# Patient Record
Sex: Male | Born: 1990 | Race: Black or African American | Hispanic: No | Marital: Single | Smoking: Current every day smoker
Health system: Southern US, Community
[De-identification: ages and names within clinical notes are randomized; demographics above are authoritative.]

## PROBLEM LIST (undated history)

## (undated) DIAGNOSIS — D582 Other hemoglobinopathies: Secondary | ICD-10-CM

## (undated) DIAGNOSIS — E786 Lipoprotein deficiency: Secondary | ICD-10-CM

## (undated) DIAGNOSIS — M47812 Spondylosis without myelopathy or radiculopathy, cervical region: Secondary | ICD-10-CM

## (undated) DIAGNOSIS — Z720 Tobacco use: Secondary | ICD-10-CM

## (undated) DIAGNOSIS — K529 Noninfective gastroenteritis and colitis, unspecified: Secondary | ICD-10-CM

## (undated) DIAGNOSIS — B078 Other viral warts: Secondary | ICD-10-CM

## (undated) DIAGNOSIS — R718 Other abnormality of red blood cells: Secondary | ICD-10-CM

## (undated) HISTORY — DX: Tobacco use: Z72.0

## (undated) HISTORY — PX: INCISIONAL HERNIA REPAIR: SHX193

## (undated) HISTORY — DX: Lipoprotein deficiency: E78.6

## (undated) HISTORY — PX: NASAL FRACTURE SURGERY: SHX718

---
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
## 2009-03-16 ENCOUNTER — Ambulatory Visit: Payer: Self-pay | Admitting: Surgery

## 2010-02-20 ENCOUNTER — Other Ambulatory Visit: Payer: Self-pay | Admitting: Family Medicine

## 2011-06-29 ENCOUNTER — Emergency Department: Payer: Self-pay | Admitting: Internal Medicine

## 2011-09-05 ENCOUNTER — Ambulatory Visit: Payer: Self-pay | Admitting: Family Medicine

## 2013-03-06 ENCOUNTER — Emergency Department: Payer: Self-pay | Admitting: Emergency Medicine

## 2013-03-10 ENCOUNTER — Emergency Department: Payer: Self-pay | Admitting: Emergency Medicine

## 2015-03-03 ENCOUNTER — Encounter: Payer: Self-pay | Admitting: Family Medicine

## 2015-06-17 ENCOUNTER — Encounter: Payer: Self-pay | Admitting: Family Medicine

## 2015-11-25 ENCOUNTER — Ambulatory Visit (INDEPENDENT_AMBULATORY_CARE_PROVIDER_SITE_OTHER): Payer: Self-pay | Admitting: Family Medicine

## 2015-11-25 ENCOUNTER — Encounter: Payer: Self-pay | Admitting: Family Medicine

## 2015-11-25 VITALS — BP 114/68 | HR 89 | Temp 98.4°F | Resp 16 | Ht 68.0 in | Wt 174.0 lb

## 2015-11-25 DIAGNOSIS — B078 Other viral warts: Secondary | ICD-10-CM

## 2015-11-25 DIAGNOSIS — Z0000 Encounter for general adult medical examination without abnormal findings: Secondary | ICD-10-CM

## 2015-11-25 DIAGNOSIS — Z114 Encounter for screening for human immunodeficiency virus [HIV]: Secondary | ICD-10-CM

## 2015-11-25 DIAGNOSIS — Z720 Tobacco use: Secondary | ICD-10-CM

## 2015-11-25 HISTORY — DX: Other viral warts: B07.8

## 2015-11-25 LAB — COMPREHENSIVE METABOLIC PANEL
ALBUMIN: 4.6 g/dL (ref 3.6–5.1)
ALK PHOS: 64 U/L (ref 40–115)
ALT: 35 U/L (ref 9–46)
AST: 23 U/L (ref 10–40)
BILIRUBIN TOTAL: 2.2 mg/dL — AB (ref 0.2–1.2)
BUN: 8 mg/dL (ref 7–25)
CALCIUM: 9.2 mg/dL (ref 8.6–10.3)
CO2: 22 mmol/L (ref 20–31)
Chloride: 106 mmol/L (ref 98–110)
Creat: 1.09 mg/dL (ref 0.60–1.35)
Glucose, Bld: 86 mg/dL (ref 65–99)
POTASSIUM: 4.1 mmol/L (ref 3.5–5.3)
Sodium: 140 mmol/L (ref 135–146)
TOTAL PROTEIN: 7.1 g/dL (ref 6.1–8.1)

## 2015-11-25 LAB — LIPID PANEL
CHOL/HDL RATIO: 9 ratio — AB (ref <=5.0)
CHOLESTEROL: 162 mg/dL (ref 125–200)
HDL: 18 mg/dL — AB (ref >=40)
LDL Cholesterol: 104 mg/dL (ref <130)
TRIGLYCERIDES: 200 mg/dL — AB (ref <150)
VLDL: 40 mg/dL — ABNORMAL HIGH (ref <30)

## 2015-11-25 NOTE — Patient Instructions (Addendum)
I do encourage you to quit smoking Call [phone] to sign up for smoking cessation classes You can call 1-800-QUIT-NOW to talk with a smoking cessation coach  Steps to Quit Smoking  Smoking tobacco can be harmful to your health and can affect almost every organ in your body. Smoking puts you, and those around you, at risk for developing many serious chronic diseases. Quitting smoking is difficult, but it is one of the best things that you can do for your health. It is never too late to quit. WHAT ARE THE BENEFITS OF QUITTING SMOKING? When you quit smoking, you lower your risk of developing serious diseases and conditions, such as:  Lung cancer or lung disease, such as COPD.  Heart disease.  Stroke.  Heart attack.  Infertility.  Osteoporosis and bone fractures. Additionally, symptoms such as coughing, wheezing, and shortness of breath may get better when you quit. You may also find that you get sick less often because your body is stronger at fighting off colds and infections. If you are pregnant, quitting smoking can help to reduce your chances of having a baby of low birth weight. HOW DO I GET READY TO QUIT? When you decide to quit smoking, create a plan to make sure that you are successful. Before you quit:  Pick a date to quit. Set a date within the next two weeks to give you time to prepare.  Write down the reasons why you are quitting. Keep this list in places where you will see it often, such as on your bathroom mirror or in your car or wallet.  Identify the people, places, things, and activities that make you want to smoke (triggers) and avoid them. Make sure to take these actions:  Throw away all cigarettes at home, at work, and in your car.  Throw away smoking accessories, such as Set designer.  Clean your car and make sure to empty the ashtray.  Clean your home, including curtains and carpets.  Tell your family, friends, and coworkers that you are quitting.  Support from your loved ones can make quitting easier.  Talk with your health care provider about your options for quitting smoking.  Find out what treatment options are covered by your health insurance. WHAT STRATEGIES CAN I USE TO QUIT SMOKING?  Talk with your healthcare provider about different strategies to quit smoking. Some strategies include:  Quitting smoking altogether instead of gradually lessening how much you smoke over a period of time. Research shows that quitting "cold [place]" is more successful than gradually quitting.  Attending in-person counseling to help you build problem-solving skills. You are more likely to have success in quitting if you attend several counseling sessions. Even short sessions of 10 minutes can be effective.  Finding resources and support systems that can help you to quit smoking and remain smoke-free after you quit. These resources are most helpful when you use them often. They can include:  Online chats with a Veterinary surgeon.  Telephone quitlines.  Printed Materials engineer.  Support groups or group counseling.  Text messaging programs.  Mobile phone applications.  Taking medicines to help you quit smoking. (If you are pregnant or breastfeeding, talk with your health care provider first.) Some medicines contain nicotine and some do not. Both types of medicines help with cravings, but the medicines that include nicotine help to relieve withdrawal symptoms. Your health care provider may recommend:  Nicotine patches, gum, or lozenges.  Nicotine inhalers or sprays.  Non-nicotine medicine that is taken  by mouth. Talk with your health care provider about combining strategies, such as taking medicines while you are also receiving in-person counseling. Using these two strategies together makes you more likely to succeed in quitting than if you used either strategy on its own. If you are pregnant or breastfeeding, talk with your health care provider  about finding counseling or other support strategies to quit smoking. Do not take medicine to help you quit smoking unless told to do so by your health care provider. WHAT THINGS CAN I DO TO MAKE IT EASIER TO QUIT? Quitting smoking might feel overwhelming at first, but there is a lot that you can do to make it easier. Take these important actions:  Reach out to your family and friends and ask that they support and encourage you during this time. Call telephone quitlines, reach out to support groups, or work with a counselor for support.  Ask people who smoke to avoid smoking around you.  Avoid places that trigger you to smoke, such as bars, parties, or smoke-break areas at work.  Spend time around people who do not smoke.  Lessen stress in your life, because stress can be a smoking trigger for some people. To lessen stress, try:  Exercising regularly.  Deep-breathing exercises.  Yoga.  Meditating.  Performing a body scan. This involves closing your eyes, scanning your body from head to toe, and noticing which parts of your body are particularly tense. Purposefully relax the muscles in those areas.  Download or purchase mobile phone or tablet apps (applications) that can help you stick to your quit plan by providing reminders, tips, and encouragement. There are many free apps, such as QuitGuide from the [employer] developer for Disease Control and Prevention). You can find other support for quitting smoking (smoking cessation) through smokefree.gov and other websites. HOW WILL I FEEL WHEN I QUIT SMOKING? Within the first 24 hours of quitting smoking, you may start to feel some withdrawal symptoms. These symptoms are usually most noticeable 2-3 days after quitting, but they usually do not last beyond 2-3 weeks. Changes or symptoms that you might experience include:  Mood swings.  Restlessness, anxiety, or irritation.  Difficulty concentrating.  Dizziness.  Strong cravings for sugary foods  in addition to nicotine.  Mild weight gain.  Constipation.  Nausea.  Coughing or a sore throat.  Changes in how your medicines work in your body.  A depressed mood.  Difficulty sleeping (insomnia). After the first 2-3 weeks of quitting, you may start to notice more positive results, such as:  Improved sense of smell and taste.  Decreased coughing and sore throat.  Slower heart rate.  Lower blood pressure.  Clearer skin.  The ability to breathe more easily.  Fewer sick days. Quitting smoking is very challenging for most people. Do not get discouraged if you are not successful the first time. Some people need to make many attempts to quit before they achieve long-term success. Do your best to stick to your quit plan, and talk with your health care provider if you have any questions or concerns.   This information is not intended to replace advice given to you by your health care provider. Make sure you discuss any questions you have with your health care provider.   Document Released: 05/15/2001 Document Revised: 10/05/2014 Document Reviewed: 10/05/2014 Elsevier Interactive Patient Education 2016 [employer]. Health Maintenance, Male A healthy lifestyle and preventative care can promote health and wellness.  Maintain regular health, dental, and eye exams.  Eat a healthy diet. Foods like vegetables, fruits, whole grains, low-fat dairy products, and lean protein foods contain the nutrients you need and are low in calories. Decrease your intake of foods high in solid fats, added sugars, and salt. Get information about a proper diet from your health care provider, if necessary.  Regular physical exercise is one of the most important things you can do for your health. Most adults should get at least 150 minutes of moderate-intensity exercise (any activity that increases your heart rate and causes you to sweat) each week. In addition, most adults need muscle-strengthening  exercises on 2 or more days a week.   Maintain a healthy weight. The body mass index (BMI) is a screening tool to identify possible weight problems. It provides an estimate of body fat based on height and weight. Your health care provider can find your BMI and can help you achieve or maintain a healthy weight. For males 20 years and older:  A BMI below 18.5 is considered underweight.  A BMI of 18.5 to 24.9 is normal.  A BMI of 25 to 29.9 is considered overweight.  A BMI of 30 and above is considered obese.  Maintain normal blood lipids and cholesterol by exercising and minimizing your intake of saturated fat. Eat a balanced diet with plenty of fruits and vegetables. Blood tests for lipids and cholesterol should begin at age 27 and be repeated every 5 years. If your lipid or cholesterol levels are high, you are over age 27, or you are at high risk for heart disease, you may need your cholesterol levels checked more frequently.Ongoing high lipid and cholesterol levels should be treated with medicines if diet and exercise are not working.  If you smoke, find out from your health care provider how to quit. If you do not use tobacco, do not start.  Lung cancer screening is recommended for adults aged 55-80 years who are at high risk for developing lung cancer because of a history of smoking. A yearly low-dose CT scan of the lungs is recommended for people who have at least a 30-pack-year history of smoking and are current smokers or have quit within the past 15 years. A pack year of smoking is smoking an average of 1 pack of cigarettes a day for 1 year (for example, a 30-pack-year history of smoking could mean smoking 1 pack a day for 30 years or 2 packs a day for 15 years). Yearly screening should continue until the smoker has stopped smoking for at least 15 years. Yearly screening should be stopped for people who develop a health problem that would prevent them from having lung cancer treatment.  If  you choose to drink alcohol, do not have more than 2 drinks per day. One drink is considered to be 12 oz (360 mL) of beer, 5 oz (150 mL) of wine, or 1.5 oz (45 mL) of liquor.  Avoid the use of street drugs. Do not share needles with anyone. Ask for help if you need support or instructions about stopping the use of drugs.  High blood pressure causes heart disease and increases the risk of stroke. High blood pressure is more likely to develop in:  People who have blood pressure in the end of the normal range (100-139/85-89 mm Hg).  People who are overweight or obese.  People who are African American.  If you are 5918-27 years of age, have your blood pressure checked every 3-5 years. If you are 27 years of age  or older, have your blood pressure checked every year. You should have your blood pressure measured twice--once when you are at a hospital or clinic, and once when you are not at a hospital or clinic. Record the average of the two measurements. To check your blood pressure when you are not at a hospital or clinic, you can use:  An automated blood pressure machine at a pharmacy.  A home blood pressure monitor.  If you are 6245-27 years old, ask your health care provider if you should take aspirin to prevent heart disease.  Diabetes screening involves taking a blood sample to check your fasting blood sugar level. This should be done once every 3 years after age 27 if you are at a normal weight and without risk factors for diabetes. Testing should be considered at a younger age or be carried out more frequently if you are overweight and have at least 1 risk factor for diabetes.  Colorectal cancer can be detected and often prevented. Most routine colorectal cancer screening begins at the age of 27 and continues through age 27. However, your health care provider may recommend screening at an earlier age if you have risk factors for colon cancer. On a yearly basis, your health care provider may  provide home test kits to check for hidden blood in the stool. A small camera at the end of a tube may be used to directly examine the colon (sigmoidoscopy or colonoscopy) to detect the earliest forms of colorectal cancer. Talk to your health care provider about this at age 27 when routine screening begins. A direct exam of the colon should be repeated every 5-10 years through age 27, unless early forms of precancerous polyps or small growths are found.  People who are at an increased risk for hepatitis B should be screened for this virus. You are considered at high risk for hepatitis B if:  You were born in a country where hepatitis B occurs often. Talk with your health care provider about which countries are considered high risk.  Your parents were born in a high-risk country and you have not received a shot to protect against hepatitis B (hepatitis B vaccine).  You have HIV or AIDS.  You use needles to inject street drugs.  You live with, or have sex with, someone who has hepatitis B.  You are a man who has sex with other men (MSM).  You get hemodialysis treatment.  You take certain medicines for conditions like cancer, organ transplantation, and autoimmune conditions.  Hepatitis C blood testing is recommended for all people born from [birth year]1945 through 1967 and any individual with known risk factors for hepatitis C.  Healthy men should no longer receive prostate-specific antigen (PSA) blood tests as part of routine cancer screening. Talk to your health care provider about prostate cancer screening.  Testicular cancer screening is not recommended for adolescents or adult males who have no symptoms. Screening includes self-exam, a health care provider exam, and other screening tests. Consult with your health care provider about any symptoms you have or any concerns you have about testicular cancer.  Practice safe sex. Use condoms and avoid high-risk sexual practices to reduce the spread of  sexually transmitted infections (STIs).  You should be screened for STIs, including gonorrhea and chlamydia if:  You are sexually active and are younger than 24 years.  You are older than 24 years, and your health care provider tells you that you are at risk for this type of infection.  Your sexual activity has changed since you were last screened, and you are at an increased risk for chlamydia or gonorrhea. Ask your health care provider if you are at risk.  If you are at risk of being infected with HIV, it is recommended that you take a prescription medicine daily to prevent HIV infection. This is called pre-exposure prophylaxis (PrEP). You are considered at risk if:  You are a man who has sex with other men (MSM).  You are a heterosexual man who is sexually active with multiple partners.  You take drugs by injection.  You are sexually active with a partner who has HIV.  Talk with your health care provider about whether you are at high risk of being infected with HIV. If you choose to begin PrEP, you should first be tested for HIV. You should then be tested every 3 months for as long as you are taking PrEP.  Use sunscreen. Apply sunscreen liberally and repeatedly throughout the day. You should seek shade when your shadow is shorter than you. Protect yourself by wearing long sleeves, pants, a wide-brimmed hat, and sunglasses year round whenever you are outdoors.  Tell your health care provider of new moles or changes in moles, especially if there is a change in shape or color. Also, tell your health care provider if a mole is larger than the size of a pencil eraser.  A one-time screening for abdominal aortic aneurysm (AAA) and surgical repair of large AAAs by ultrasound is recommended for men aged 65-75 years who are current or former smokers.  Stay current with your vaccines (immunizations).   This information is not intended to replace advice given to you by your health care provider.  Make sure you discuss any questions you have with your health care provider.   Document Released: 11/17/2007 Document Revised: 06/11/2014 Document Reviewed: 10/16/2010 Elsevier Interactive Patient Education [employer]

## 2015-11-25 NOTE — Progress Notes (Signed)
Patient ID: Douglas Greer, male   DOB: 09-15-1990, 25 y.o.   MRN: [id number]   Subjective:   Douglas Greer is a 25 y.o. male here for a complete physical exam His usual provider is unavailable and he is new to me  Interim issues since last visit: wisdom teeth extracted  USPSTF grade A and B recommendations Alcohol: less than 14 drinks per week Depression:  Depression screen [facility] 11/25/2015  Decreased Interest 0  Down, Depressed, Hopeless 0  PHQ - 2 Score 0   Hypertension: well-controlled Obesity: no Tobacco use: current user; 5-6 cigs HIV, hep B, hep C: aggreed to HIV STD testing and prevention (chl/gon/syphilis): wants to be checked, bumps in creases of legs; nothing on penis, no blisters; no discharge; no pain with urination or ejaculation, no blood in urine or semen Lipids: check today Glucose: check today Colorectal cancer: no fam hx Diet: pretty good eater; calcium rich foods Exercise: tries to exercise Skin cancer: no worrisome moles  Past Medical History  Diagnosis Date  . Low HDL (under 40) 11/26/2015  . Tobacco use 11/27/2015     Past Surgical History  Procedure Laterality Date  . Incisional hernia repair    . Nasal fracture surgery    wisdom teeth extraction x 4, 2016  Family History  Problem Relation Age of Onset  . Hypertension Mother   . Diabetes Father   . Asthma Son   . Allergies Son     peanuts   Social History  Substance Use Topics  . Smoking status: Current Every Day Smoker  . Smokeless tobacco: Never Used  . Alcohol Use: 0.0 oz/week    0 Standard drinks or equivalent per week   Review of Systems  Constitutional: Negative for fever and chills.  HENT: Negative for hearing loss.   Eyes: Negative for visual disturbance.  Cardiovascular: Negative for chest pain and leg swelling.  Gastrointestinal: Negative for blood in stool.  Endocrine: Negative for polydipsia and polyuria.  Genitourinary: Negative for hematuria and discharge.   Skin:       No worrisome moles  Hematological: Negative for adenopathy. Does not bruise/bleed easily.  Psychiatric/Behavioral: Negative for dysphoric mood.    Objective:   Filed Vitals:   11/25/15 1146  BP: 114/68  Pulse: 89  Temp: 98.4 F (36.9 C)  TempSrc: Oral  Resp: 16  Height: 5\' 8"  (1.727 m)  Weight: 174 lb (78.926 kg)  SpO2: 97%   Body mass index is 26.46 kg/(m^2). Wt Readings from Last 3 Encounters:  11/25/15 174 lb (78.926 kg)   Physical Exam  Constitutional: He appears well-developed and well-nourished. No distress.  HENT:  Head: Normocephalic and atraumatic.  Nose: Nose normal.  Mouth/Throat: Oropharynx is clear and moist.  Eyes: EOM are normal. No scleral icterus.  Neck: No JVD present. No thyromegaly present.  Cardiovascular: Normal rate, regular rhythm and normal heart sounds.   Pulmonary/Chest: Effort normal and breath sounds normal. No respiratory distress. He has no wheezes. He has no rales.  Abdominal: Soft. Bowel sounds are normal. He exhibits no distension. There is no tenderness. There is no guarding.  Musculoskeletal: Normal range of motion. He exhibits no edema.  Lymphadenopathy:    He has no cervical adenopathy.  Neurological: He is alert. He displays no tremor and normal reflexes. He exhibits normal muscle tone. Coordination normal.  No tics  Skin: Skin is warm and dry. No rash noted. He is not diaphoretic. No erythema. No pallor.  Multiple verrucous lesions in the  groin, inguinal area; flat based  Psychiatric: He has a normal mood and affect. His behavior is normal. Judgment and thought content normal. His mood appears not anxious. He does not exhibit a depressed mood.    Assessment/Plan:   Problem List Items Addressed This Visit      Musculoskeletal and Integument   Verruca plana    In the genital region; encouraged patient to be safe; warts can spread to partner; refer to derm for treatment      Relevant Orders   Ambulatory referral  to Dermatology     Other   Preventative health care - Primary    USPSTF grade A and B recommendations reviewed with patient; age-appropriate recommendations, preventive care, screening tests, etc discussed and encouraged; healthy living encouraged; see AVS for patient education given to patient      Relevant Orders   Comprehensive metabolic panel (Completed)   Lipid panel (Completed)   Screening for HIV (human immunodeficiency virus)    Discussed one-time HIV screening recommendation per USPSTF guidelines; patient agrees with testing; HIV antibody ordered      Relevant Orders   HIV antibody (Completed)   Tobacco use    Encouraged patient to quis moking completely; he is so close; discussed if he doesn't quit now, he may develop into a heavier smoker, then have difficulty quitting in his 4940s or 1250s; cited health risks including ED, stroke, heart attack          Orders Placed This Encounter  Procedures  . Comprehensive metabolic panel    Standing Status: Future     Number of Occurrences: 1     Standing Expiration Date: 12/02/2015    Order Specific Question:  Has the patient fasted?    Answer:  Yes  . Lipid panel    Standing Status: Future     Number of Occurrences: 1     Standing Expiration Date: 12/02/2015    Order Specific Question:  Has the patient fasted?    Answer:  Yes  . HIV antibody    Standing Status: Future     Number of Occurrences: 1     Standing Expiration Date: 12/02/2015  . Ambulatory referral to Dermatology    Referral Priority:  Routine    Referral Type:  Consultation    Referral Reason:  Specialty Services Required    Requested Specialty:  Dermatology    Number of Visits Requested:  1    Follow up plan: Return in about 1 year (around 11/24/2016) for complete physical.  An After Visit Summary was printed and given to the patient.

## 2015-11-26 ENCOUNTER — Other Ambulatory Visit: Payer: Self-pay | Admitting: Family Medicine

## 2015-11-26 ENCOUNTER — Encounter: Payer: Self-pay | Admitting: Family Medicine

## 2015-11-26 DIAGNOSIS — E786 Lipoprotein deficiency: Secondary | ICD-10-CM

## 2015-11-26 DIAGNOSIS — R17 Unspecified jaundice: Secondary | ICD-10-CM

## 2015-11-26 HISTORY — DX: Lipoprotein deficiency: E78.6

## 2015-11-26 LAB — HIV ANTIBODY (ROUTINE TESTING W REFLEX): HIV 1&2 Ab, 4th Generation: NONREACTIVE

## 2015-11-27 ENCOUNTER — Encounter: Payer: Self-pay | Admitting: Family Medicine

## 2015-11-27 DIAGNOSIS — Z720 Tobacco use: Secondary | ICD-10-CM

## 2015-11-27 HISTORY — DX: Tobacco use: Z72.0

## 2015-11-27 NOTE — Assessment & Plan Note (Signed)
USPSTF grade A and B recommendations reviewed with patient; age-appropriate recommendations, preventive care, screening tests, etc discussed and encouraged; healthy living encouraged; see AVS for patient education given to patient  

## 2015-11-27 NOTE — Assessment & Plan Note (Signed)
In the genital region; encouraged patient to be safe; warts can spread to partner; refer to derm for treatment

## 2015-11-27 NOTE — Assessment & Plan Note (Signed)
Discussed one-time HIV screening recommendation per USPSTF guidelines; patient agrees with testing; HIV antibody ordered 

## 2015-11-27 NOTE — Assessment & Plan Note (Signed)
Encouraged patient to quis moking completely; he is so close; discussed if he doesn't quit now, he may develop into a heavier smoker, then have difficulty quitting in his 8240s or 250s; cited health risks including ED, stroke, heart attack

## 2016-04-13 ENCOUNTER — Emergency Department
Admission: EM | Admit: 2016-04-13 | Discharge: 2016-04-13 | Disposition: A | Discharge status: Home / Self Care | Payer: No Typology Code available for payment source | Attending: Emergency Medicine | Admitting: Emergency Medicine

## 2016-04-13 ENCOUNTER — Emergency Department: Payer: No Typology Code available for payment source

## 2016-04-13 ENCOUNTER — Encounter: Payer: Self-pay | Admitting: *Deleted

## 2016-04-13 DIAGNOSIS — S8001XA Contusion of right knee, initial encounter: Secondary | ICD-10-CM

## 2016-04-13 DIAGNOSIS — S161XXA Strain of muscle, fascia and tendon at neck level, initial encounter: Secondary | ICD-10-CM | POA: Insufficient documentation

## 2016-04-13 DIAGNOSIS — Y9389 Activity, other specified: Secondary | ICD-10-CM | POA: Insufficient documentation

## 2016-04-13 DIAGNOSIS — Y92410 Unspecified street and highway as the place of occurrence of the external cause: Secondary | ICD-10-CM | POA: Diagnosis not present

## 2016-04-13 DIAGNOSIS — S199XXA Unspecified injury of neck, initial encounter: Secondary | ICD-10-CM | POA: Diagnosis present

## 2016-04-13 DIAGNOSIS — Y999 Unspecified external cause status: Secondary | ICD-10-CM | POA: Insufficient documentation

## 2016-04-13 DIAGNOSIS — F17200 Nicotine dependence, unspecified, uncomplicated: Secondary | ICD-10-CM | POA: Insufficient documentation

## 2016-04-13 MED ORDER — NAPROXEN 500 MG PO TABS: 500.0000 mg | ORAL_TABLET | Freq: Two times a day (BID) | ORAL | 0 refills | Status: DC
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
## 2016-06-22 ENCOUNTER — Encounter: Payer: Self-pay | Admitting: *Deleted

## 2016-06-22 ENCOUNTER — Ambulatory Visit
Admission: EM | Admit: 2016-06-22 | Discharge: 2016-06-22 | Disposition: A | Discharge status: Home / Self Care | Payer: BLUE CROSS/BLUE SHIELD | Attending: Family Medicine | Admitting: Family Medicine

## 2016-06-22 DIAGNOSIS — J069 Acute upper respiratory infection, unspecified: Secondary | ICD-10-CM

## 2016-06-22 LAB — RAPID INFLUENZA A&B ANTIGENS (ARMC ONLY)
INFLUENZA A (ARMC): NEGATIVE
INFLUENZA B (ARMC): NEGATIVE

## 2016-06-22 MED ORDER — CETIRIZINE-PSEUDOEPHEDRINE ER 5-120 MG PO TB12: 1.0000 | ORAL_TABLET | Freq: Two times a day (BID) | ORAL | 0 refills | Status: DC

## 2016-06-22 MED ORDER — FLUTICASONE PROPIONATE 50 MCG/ACT NA SUSP: 2.0000 | Freq: Every day | NASAL | 0 refills | Status: DC

## 2016-06-22 MED ORDER — HYDROCOD POLST-CPM POLST ER 10-8 MG/5ML PO SUER: 5.0000 mL | Freq: Two times a day (BID) | ORAL | 0 refills | Status: DC

## 2016-06-22 NOTE — ED Triage Notes (Signed)
Patient started having symptoms of nasal congestion / drainage, fever and body aches 5 days ago.

## 2016-06-22 NOTE — ED Provider Notes (Signed)
CSN: [id number]     Arrival date & time 06/22/16  4098 History   First MD Initiated Contact with Patient 06/22/16 1111     Chief Complaint  Patient presents with  . Nasal Congestion  . Fever  . Generalized Body Aches   (Consider location/radiation/quality/duration/timing/severity/associated sxs/prior Treatment) HPI  Is a 26 year old male who presents with  nasal congestion/ drainage, fever and body aches forr about 5 days. He did receive his flu shot. His coughing is worse at nighttime but does bother him during the day. It is largely nonproductive. Temp was 98.4, pulse 78 ,blood pressure 123/83, respirations 16 ,O2 sats on room air 100%       Past Medical History:  Diagnosis Date  . Low HDL (under 40) 11/26/2015  . Tobacco use 11/27/2015   Past Surgical History:  Procedure Laterality Date  . INCISIONAL HERNIA REPAIR    . NASAL FRACTURE SURGERY     Family History  Problem Relation Age of Onset  . Hypertension Mother   . Diabetes Father   . Asthma Son   . Allergies Son     peanuts   Social History  Substance Use Topics  . Smoking status: Current Every Day Smoker  . Smokeless tobacco: Never Used  . Alcohol use 0.0 oz/week    Review of Systems  Constitutional: Positive for activity change, chills, fatigue and fever.  HENT: Positive for congestion, postnasal drip and sneezing.   Respiratory: Positive for cough. Negative for shortness of breath, wheezing and stridor.   All other systems reviewed and are negative.   Allergies  Amoxil [amoxicillin]  Home Medications   Prior to Admission medications   Medication Sig Start Date End Date Taking? Authorizing Provider  cetirizine-pseudoephedrine (ZYRTEC-D) 5-120 MG tablet Take 1 tablet by mouth 2 (two) times daily. 06/22/16   Lutricia Feil, PA-C  chlorpheniramine-HYDROcodone (TUSSIONEX PENNKINETIC ER) 10-8 MG/5ML SUER Take 5 mLs by mouth 2 (two) times daily. 06/22/16   Lutricia Feil, PA-C  fluticasone (FLONASE) 50  MCG/ACT nasal spray Place 2 sprays into both nostrils daily. 06/22/16   Lutricia Feil, PA-C  naproxen (NAPROSYN) 500 MG tablet Take 1 tablet (500 mg total) by mouth 2 (two) times daily with a meal. 04/13/16   Sharman Cheek, MD   Meds Ordered and Administered this Visit  Medications - No data to display  BP 123/83 (BP Location: Right Arm)   Pulse 78   Temp 98.4 F (36.9 C) (Oral)   Resp 16   Ht 5\' 8"  (1.727 m)   Wt 175 lb (79.4 kg)   SpO2 100%   BMI 26.61 kg/m  No data found.   Physical Exam  Constitutional: He is oriented to person, place, and time. He appears well-developed and well-nourished. No distress.  HENT:  Head: Normocephalic and atraumatic.  Right Ear: External ear normal.  Left Ear: External ear normal.  Nose: Nose normal.  Mouth/Throat: Oropharynx is clear and moist. No oropharyngeal exudate.  Eyes: EOM are normal. Pupils are equal, round, and reactive to light. Right eye exhibits no discharge. Left eye exhibits no discharge.  Neck: Normal range of motion. Neck supple.  Pulmonary/Chest: Effort normal and breath sounds normal. No respiratory distress. He has no wheezes. He has no rales.  Musculoskeletal: Normal range of motion.  Lymphadenopathy:    He has no cervical adenopathy.  Neurological: He is alert and oriented to person, place, and time.  Skin: Skin is warm and dry. He is not diaphoretic.  Psychiatric:  He has a normal mood and affect. His behavior is normal. Judgment and thought content normal.  Nursing note and vitals reviewed.   Urgent Care Course     Procedures (including critical care time)  Labs Review Labs Reviewed  RAPID INFLUENZA A&B ANTIGENS [facility] ONLY)    Imaging Review No results found.   Visual Acuity Review  Right Eye Distance:   Left Eye Distance:   Bilateral Distance:    Right Eye Near:   Left Eye Near:    Bilateral Near:         MDM   1. Upper respiratory tract infection, unspecified type    Discharge  Medication List as of 06/22/2016 11:31 AM    START taking these medications   Details  cetirizine-pseudoephedrine (ZYRTEC-D) 5-120 MG tablet Take 1 tablet by mouth 2 (two) times daily., Starting Fri 06/22/2016, Normal    chlorpheniramine-HYDROcodone (TUSSIONEX PENNKINETIC ER) 10-8 MG/5ML SUER Take 5 mLs by mouth 2 (two) times daily., Starting Fri 06/22/2016, Print    fluticasone (FLONASE) 50 MCG/ACT nasal spray Place 2 sprays into both nostrils daily., Starting Fri 06/22/2016, Normal      Plan: 1. Test/x-ray results and diagnosis reviewed with patient 2. rx as per orders; risks, benefits, potential side effects reviewed with patient 3. Recommend supportive treatment with Fluids and rest. Caution regarding use of Tussionex with driving or performing activities require concentration and judgment. He is not improving or if he worsens he should follow-up with a primary care physician 4. F/u prn if symptoms worsen or don't improve     Lutricia FeilWilliam P Artur Winningham, PA-C 06/22/16 1143

## 2016-07-05 ENCOUNTER — Emergency Department
Admission: EM | Admit: 2016-07-05 | Discharge: 2016-07-05 | Disposition: A | Discharge status: Home / Self Care | Payer: BLUE CROSS/BLUE SHIELD | Attending: Emergency Medicine | Admitting: Emergency Medicine

## 2016-07-05 ENCOUNTER — Encounter: Payer: Self-pay | Admitting: Emergency Medicine

## 2016-07-05 ENCOUNTER — Emergency Department: Payer: BLUE CROSS/BLUE SHIELD

## 2016-07-05 DIAGNOSIS — M4692 Unspecified inflammatory spondylopathy, cervical region: Secondary | ICD-10-CM | POA: Diagnosis not present

## 2016-07-05 DIAGNOSIS — F17210 Nicotine dependence, cigarettes, uncomplicated: Secondary | ICD-10-CM | POA: Diagnosis not present

## 2016-07-05 DIAGNOSIS — M542 Cervicalgia: Secondary | ICD-10-CM | POA: Diagnosis present

## 2016-07-05 DIAGNOSIS — M47812 Spondylosis without myelopathy or radiculopathy, cervical region: Secondary | ICD-10-CM

## 2016-07-05 MED ORDER — MELOXICAM 15 MG PO TABS
15.0000 mg | ORAL_TABLET | Freq: Every day | ORAL | 0 refills | Status: DC
Start: 2016-07-05 — End: 2017-06-21

## 2016-07-05 MED ORDER — METHOCARBAMOL 500 MG PO TABS
500.0000 mg | ORAL_TABLET | Freq: Every day | ORAL | 0 refills | Status: DC
Start: 2016-07-05 — End: 2017-06-21
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
## 2016-07-12 ENCOUNTER — Ambulatory Visit: Payer: PRIVATE HEALTH INSURANCE | Admitting: Family Medicine

## 2016-08-29 ENCOUNTER — Encounter: Payer: Self-pay | Admitting: Emergency Medicine

## 2016-08-29 ENCOUNTER — Emergency Department
Admission: EM | Admit: 2016-08-29 | Discharge: 2016-08-29 | Disposition: A | Discharge status: Home / Self Care | Payer: BLUE CROSS/BLUE SHIELD | Attending: Emergency Medicine | Admitting: Emergency Medicine

## 2016-08-29 DIAGNOSIS — F17210 Nicotine dependence, cigarettes, uncomplicated: Secondary | ICD-10-CM | POA: Diagnosis not present

## 2016-08-29 DIAGNOSIS — M542 Cervicalgia: Secondary | ICD-10-CM | POA: Diagnosis not present

## 2016-08-29 MED ORDER — TRAMADOL HCL 50 MG PO TABS: 50.0000 mg | ORAL_TABLET | Freq: Four times a day (QID) | ORAL | 0 refills | Status: DC | PRN

## 2016-08-29 MED ORDER — METHOCARBAMOL 750 MG PO TABS: 750.0000 mg | ORAL_TABLET | Freq: Four times a day (QID) | ORAL | 0 refills | Status: DC
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
## 2016-08-29 NOTE — ED Triage Notes (Addendum)
Pt to ED via POV with c/o neck pain since November 2017, states he wants MRI. Pt in NAD at this time
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
## 2016-10-04 DIAGNOSIS — A630 Anogenital (venereal) warts: Secondary | ICD-10-CM | POA: Insufficient documentation

## 2016-10-04 LAB — HM HIV SCREENING LAB: HM HIV Screening: NEGATIVE

## 2017-02-25 ENCOUNTER — Ambulatory Visit (INDEPENDENT_AMBULATORY_CARE_PROVIDER_SITE_OTHER): Payer: Medicaid Other | Admitting: Family Medicine

## 2017-02-25 ENCOUNTER — Encounter: Payer: Self-pay | Admitting: Family Medicine

## 2017-02-25 VITALS — BP 100/68 | HR 84 | Temp 97.9°F | Resp 18 | Ht 68.0 in | Wt 170.9 lb

## 2017-02-25 DIAGNOSIS — K1370 Unspecified lesions of oral mucosa: Secondary | ICD-10-CM

## 2017-02-25 DIAGNOSIS — B370 Candidal stomatitis: Secondary | ICD-10-CM

## 2017-02-25 MED ORDER — NYSTATIN 100000 UNIT/ML MT SUSP: 5.0000 mL | Freq: Four times a day (QID) | OROMUCOSAL | 0 refills | Status: DC

## 2017-02-25 NOTE — Progress Notes (Addendum)
Name: Douglas Greer   MRN: [id number]    DOB: 06/10/1990   Date:02/25/2017       Progress Note  Subjective  Chief Complaint  Chief Complaint  Patient presents with  . Mouth Lesions    white spots, no pain    HPI  Patient presents with 1 month of new onset "bumps" to the back of his tongue, and the surface of his tongue turning white. He is a current smoker - has been smoking for 9 years x1ppd.  Denies pain to the lesions, no irritation or itching, no bleeding or taste of blood, no discharge. States lesions have gotten a little bit small over the last few weeks but otherwise no change.    Patient Active Problem List   Diagnosis Date Noted  . Tobacco use 11/27/2015  . Total bilirubin, elevated 11/26/2015  . Low HDL (under 40) 11/26/2015  . Screening for HIV (human immunodeficiency virus) 11/25/2015  . Preventative health care 11/25/2015  . Verruca plana 11/25/2015    Social History  Substance Use Topics  . Smoking status: Current Every Day Smoker    Packs/day: 0.50    Types: Cigarettes  . Smokeless tobacco: Never Used  . Alcohol use 0.0 oz/week     Comment: occassional    Current Outpatient Prescriptions:  .  chlorpheniramine-HYDROcodone (TUSSIONEX PENNKINETIC ER) 10-8 MG/5ML SUER, Take 5 mLs by mouth 2 (two) times daily. (Patient not taking: Reported on 02/25/2017), Disp: 115 mL, Rfl: 0 .  fluticasone (FLONASE) 50 MCG/ACT nasal spray, Place 2 sprays into both nostrils daily. (Patient not taking: Reported on 02/25/2017), Disp: 16 g, Rfl: 0 .  meloxicam (MOBIC) 15 MG tablet, Take 1 tablet (15 mg total) by mouth daily. (Patient not taking: Reported on 02/25/2017), Disp: 30 tablet, Rfl: 0 .  methocarbamol (ROBAXIN) 500 MG tablet, Take 1 tablet (500 mg total) by mouth at bedtime. (Patient not taking: Reported on 02/25/2017), Disp: 16 tablet, Rfl: 0 .  methocarbamol (ROBAXIN-750) 750 MG tablet, Take 1 tablet (750 mg total) by mouth 4 (four) times daily. (Patient not taking:  Reported on 02/25/2017), Disp: 20 tablet, Rfl: 0 .  traMADol (ULTRAM) 50 MG tablet, Take 1 tablet (50 mg total) by mouth every 6 (six) hours as needed. (Patient not taking: Reported on 02/25/2017), Disp: 20 tablet, Rfl: 0  Allergies  Allergen Reactions  . Amoxil [Amoxicillin] Rash    ROS  Constitutional: Negative for fever or weight change.  HEENT: See HPI Respiratory: Negative for cough and shortness of breath.   Cardiovascular: Negative for chest pain or palpitations.  Gastrointestinal: Negative for abdominal pain, no bowel changes.  Musculoskeletal: Negative for gait problem or joint swelling.  Skin: Negative for rash.  Neurological: Negative for dizziness or headache.  No other specific complaints in a complete review of systems (except as listed in HPI above).  Objective  Vitals:   02/25/17 0903  BP: 100/68  Pulse: 84  Resp: 18  Temp: 97.9 F (36.6 C)  TempSrc: Oral  SpO2: 98%  Weight: 170 lb 14.4 oz (77.5 kg)  Height:  (1.727 m)    Body mass index is 25.99 kg/m.  Nursing Note and Vital Signs reviewed.  Physical Exam  Constitutional: Patient appears well-developed and well-nourished. No distress.  HEENT: head atraumatic, normocephalic, neck supple without lymphadenopathy, oropharynx pink and moist without exudate.  Posterior tongue shows multiple pink raised lesions that are non-tender and non-inflamed - consistent with vallate papillae; surface of tongue exhibits off-white film. RIGHT lateral tongue  shows approx 0.5cm ovoid lesion with central clearing. No other lesions noted Cardiovascular: Normal rate, regular rhythm, S1/S2 present.  No murmur or rub heard. No BLE edema. Pulmonary/Chest: Effort normal and breath sounds clear. No respiratory distress or retractions. Psychiatric: Patient has a normal mood and affect. behavior is normal. Judgment and thought content normal.  No results found for this or any previous visit (from the past 2160  hour(s)).   Assessment & Plan  1. Oral candidiasis - nystatin (MYCOSTATIN) 100000 UNIT/ML suspension; Take 5 mLs (500,000 Units total) by mouth 4 (four) times daily. Swish and Spit. Do not Swallow  Dispense: 60 mL; Refill: 0  2. Oral lesion - We will try nystatin for 1 week, patient to schedule follow up. If lesion is still present, we will refer to oral surgery for further evaluation and possible biopsy. - Strongly encouraged smoking cessation, patient will think on this and is not ready to quit at this time.   ------------------------------------ I have reviewed this encounter including the documentation in this note and/or discussed this patient with the Deboraha Sprang, FNP, NP-C. I am certifying that I agree with the content of this note as supervising physician.  Baruch Gouty, MD [facility] 02/27/2017, 2:04 PM

## 2017-02-25 NOTE — Patient Instructions (Addendum)
Oral Thrush, Adult Oral thrush is an infection in your mouth and throat. It causes white patches on your tongue and in your mouth. Follow these instructions at home: Helping with soreness  To lessen your pain: ? Drink cold liquids, like water and iced tea. ? Eat frozen ice pops or frozen juices. ? Eat foods that are easy to swallow, like gelatin and ice cream. ? Drink from a straw if the patches in your mouth are painful. General instructions   Take or use over-the-counter and prescription medicines only as told by your doctor. Medicine for oral thrush may be something to swallow, or it may be something to put on the infected area.  Eat plain yogurt that has live cultures in it. Read the label to make sure.  If you wear dentures: ? Take out your dentures before you go to bed. ? Brush them well. ? Soak them in a denture cleaner.  Rinse your mouth with warm salt-water many times a day. To make the salt-water mixture, completely dissolve 1/2-1 teaspoon of salt in 1 cup of warm water. Contact a doctor if:  Your problems are getting worse.  Your problems do not get better in less than 7 days with treatment.  Your infection is spreading. This may show as white patches on the skin outside of your mouth.  You are nursing your baby and you have redness and pain in the nipples. This information is not intended to replace advice given to you by your health care provider. Make sure you discuss any questions you have with your health care provider. Document Released: 08/15/2009 Document Revised: 02/13/2016 Document Reviewed: 02/13/2016 Elsevier Interactive Patient Education  2017 [employer].  Steps to Quit Smoking Smoking tobacco can be bad for your health. It can also affect almost every organ in your body. Smoking puts you and people around you at risk for many serious long-lasting (chronic) diseases. Quitting smoking is hard, but it is one of the best things that you can do for your  health. It is never too late to quit. What are the benefits of quitting smoking? When you quit smoking, you lower your risk for getting serious diseases and conditions. They can include:  Lung cancer or lung disease.  Heart disease.  Stroke.  Heart attack.  Not being able to have children (infertility).  Weak bones (osteoporosis) and broken bones (fractures).  If you have coughing, wheezing, and shortness of breath, those symptoms may get better when you quit. You may also get sick less often. If you are pregnant, quitting smoking can help to lower your chances of having a baby of low birth weight. What can I do to help me quit smoking? Talk with your doctor about what can help you quit smoking. Some things you can do (strategies) include:  Quitting smoking totally, instead of slowly cutting back how much you smoke over a period of time.  Going to in-person counseling. You are more likely to quit if you go to many counseling sessions.  Using resources and support systems, such as: ? Agricultural engineer with a Veterinary surgeon. ? Phone quitlines. ? Automotive engineer. ? Support groups or group counseling. ? Text messaging programs. ? Mobile phone apps or applications.  Taking medicines. Some of these medicines may have nicotine in them. If you are pregnant or breastfeeding, do not take any medicines to quit smoking unless your doctor says it is okay. Talk with your doctor about counseling or other things that can help you.  Talk with your doctor about using more than one strategy at the same time, such as taking medicines while you are also going to in-person counseling. This can help make quitting easier. What things can I do to make it easier to quit? Quitting smoking might feel very hard at first, but there is a lot that you can do to make it easier. Take these steps:  Talk to your family and friends. Ask them to support and encourage you.  Call phone quitlines, reach out to  support groups, or work with a Veterinary surgeon.  Ask people who smoke to not smoke around you.  Avoid places that make you want (trigger) to smoke, such as: ? Bars. ? Parties. ? Smoke-break areas at work.  Spend time with people who do not smoke.  Lower the stress in your life. Stress can make you want to smoke. Try these things to help your stress: ? Getting regular exercise. ? Deep-breathing exercises. ? Yoga. ? Meditating. ? Doing a body scan. To do this, close your eyes, focus on one area of your body at a time from head to toe, and notice which parts of your body are tense. Try to relax the muscles in those areas.  Download or buy apps on your mobile phone or tablet that can help you stick to your quit plan. There are many free apps, such as QuitGuide from the [employer] developer for Disease Control and Prevention). You can find more support from smokefree.gov and other websites.  This information is not intended to replace advice given to you by your health care provider. Make sure you discuss any questions you have with your health care provider. Document Released: 03/17/2009 Document Revised: 01/17/2016 Document Reviewed: 10/05/2014 Elsevier Interactive Patient Education  2018 [employer].

## 2017-03-04 ENCOUNTER — Telehealth: Payer: Self-pay | Admitting: Family Medicine

## 2017-03-04 ENCOUNTER — Ambulatory Visit: Payer: Medicaid Other | Admitting: Family Medicine

## 2017-03-04 NOTE — Telephone Encounter (Signed)
Pt no-showed for his appointment today. Please call him to check in and reschedule. Thanks!

## 2017-03-04 NOTE — Telephone Encounter (Signed)
Called patient left message for him to call office 

## 2017-05-01 ENCOUNTER — Encounter: Payer: Self-pay | Admitting: Family Medicine

## 2017-06-18 ENCOUNTER — Encounter: Payer: Medicaid Other | Admitting: Family Medicine

## 2017-06-21 ENCOUNTER — Encounter: Payer: Self-pay | Admitting: Family Medicine

## 2017-06-21 ENCOUNTER — Other Ambulatory Visit: Payer: Self-pay | Admitting: Family Medicine

## 2017-06-21 ENCOUNTER — Ambulatory Visit: Payer: Medicaid Other | Admitting: Family Medicine

## 2017-06-21 VITALS — BP 128/84 | HR 92 | Temp 98.4°F | Resp 16 | Wt 178.3 lb

## 2017-06-21 DIAGNOSIS — R718 Other abnormality of red blood cells: Secondary | ICD-10-CM | POA: Insufficient documentation

## 2017-06-21 DIAGNOSIS — Z720 Tobacco use: Secondary | ICD-10-CM

## 2017-06-21 DIAGNOSIS — D582 Other hemoglobinopathies: Secondary | ICD-10-CM

## 2017-06-21 DIAGNOSIS — Z0000 Encounter for general adult medical examination without abnormal findings: Secondary | ICD-10-CM

## 2017-06-21 DIAGNOSIS — Z23 Encounter for immunization: Secondary | ICD-10-CM | POA: Diagnosis not present

## 2017-06-21 HISTORY — DX: Other abnormality of red blood cells: R71.8

## 2017-06-21 HISTORY — DX: Other hemoglobinopathies: D58.2

## 2017-06-21 LAB — COMPLETE METABOLIC PANEL WITH GFR
AG Ratio: 2 (calc) (ref 1.0–2.5)
ALBUMIN MSPROF: 4.7 g/dL (ref 3.6–5.1)
ALKALINE PHOSPHATASE (APISO): 89 U/L (ref 40–115)
ALT: 35 U/L (ref 9–46)
AST: 20 U/L (ref 10–40)
BILIRUBIN TOTAL: 0.8 mg/dL (ref 0.2–1.2)
BUN: 8 mg/dL (ref 7–25)
CHLORIDE: 107 mmol/L (ref 98–110)
CO2: 25 mmol/L (ref 20–32)
CREATININE: 1.2 mg/dL (ref 0.60–1.35)
Calcium: 9.6 mg/dL (ref 8.6–10.3)
GFR, Est African American: 95 mL/min/{1.73_m2} (ref >=60)
GFR, Est Non African American: 82 mL/min/{1.73_m2} (ref >=60)
GLUCOSE: 80 mg/dL (ref 65–99)
Globulin: 2.3 g/dL (calc) (ref 1.9–3.7)
Potassium: 4.2 mmol/L (ref 3.5–5.3)
Sodium: 141 mmol/L (ref 135–146)
Total Protein: 7 g/dL (ref 6.1–8.1)

## 2017-06-21 LAB — LIPID PANEL
CHOL/HDL RATIO: 10.6 (calc) — AB (ref <5.0)
Cholesterol: 169 mg/dL (ref <200)
HDL: 16 mg/dL — ABNORMAL LOW (ref >40)
LDL CHOLESTEROL (CALC): 128 mg/dL — AB
Non-HDL Cholesterol (Calc): 153 mg/dL (calc) — ABNORMAL HIGH (ref <130)
Triglycerides: 133 mg/dL (ref <150)

## 2017-06-21 LAB — CBC WITH DIFFERENTIAL/PLATELET
BASOS ABS: 43 {cells}/uL (ref 0–200)
Basophils Relative: 0.4 %
EOS ABS: 193 {cells}/uL (ref 15–500)
EOS PCT: 1.8 %
HCT: 50.6 % — ABNORMAL HIGH (ref 38.5–50.0)
HEMOGLOBIN: 18 g/dL — AB (ref 13.2–17.1)
Lymphs Abs: 2525 cells/uL (ref 850–3900)
MCH: 33.5 pg — AB (ref 27.0–33.0)
MCHC: 35.6 g/dL (ref 32.0–36.0)
MCV: 94.2 fL (ref 80.0–100.0)
MONOS PCT: 6.6 %
MPV: 12.2 fL (ref 7.5–12.5)
NEUTROS ABS: 7233 {cells}/uL (ref 1500–7800)
Neutrophils Relative %: 67.6 %
Platelets: 161 10*3/uL (ref 140–400)
RBC: 5.37 10*6/uL (ref 4.20–5.80)
RDW: 11.7 % (ref 11.0–15.0)
TOTAL LYMPHOCYTE: 23.6 %
WBC mixed population: 706 cells/uL (ref 200–950)
WBC: 10.7 10*3/uL (ref 3.8–10.8)

## 2017-06-21 LAB — TSH: TSH: 0.81 mIU/L (ref 0.40–4.50)
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
## 2017-06-21 NOTE — Assessment & Plan Note (Signed)
Encouraged cessation, I am here to help if needed

## 2017-06-21 NOTE — Progress Notes (Signed)
Recheck CBC in 4 weeks. 

## 2017-06-21 NOTE — Assessment & Plan Note (Signed)
USPSTF grade A and B recommendations reviewed with patient; age-appropriate recommendations, preventive care, screening tests, etc discussed and encouraged; healthy living encouraged; see AVS for patient education given to patient  

## 2017-06-21 NOTE — Patient Instructions (Addendum)
I do encourage you to quit smoking Call [phone] to sign up for smoking cessation classes You can call 1-800-QUIT-NOW to talk with a smoking cessation coach   Steps to Quit Smoking Smoking tobacco can be bad for your health. It can also affect almost every organ in your body. Smoking puts you and people around you at risk for many serious long-lasting (chronic) diseases. Quitting smoking is hard, but it is one of the best things that you can do for your health. It is never too late to quit. What are the benefits of quitting smoking? When you quit smoking, you lower your risk for getting serious diseases and conditions. They can include:  Lung cancer or lung disease.  Heart disease.  Stroke.  Heart attack.  Not being able to have children (infertility).  Weak bones (osteoporosis) and broken bones (fractures).  If you have coughing, wheezing, and shortness of breath, those symptoms may get better when you quit. You may also get sick less often. If you are pregnant, quitting smoking can help to lower your chances of having a baby of low birth weight. What can I do to help me quit smoking? Talk with your doctor about what can help you quit smoking. Some things you can do (strategies) include:  Quitting smoking totally, instead of slowly cutting back how much you smoke over a period of time.  Going to in-person counseling. You are more likely to quit if you go to many counseling sessions.  Using resources and support systems, such as: ? Online chats with a counselor. ? Phone quitlines. ? Printed self-help materials. ? Support groups or group counseling. ? Text messaging programs. ? Mobile phone apps or applications.  Taking medicines. Some of these medicines may have nicotine in them. If you are pregnant or breastfeeding, do not take any medicines to quit smoking unless your doctor says it is okay. Talk with your doctor about counseling or other things that can help you.  Talk  with your doctor about using more than one strategy at the same time, such as taking medicines while you are also going to in-person counseling. This can help make quitting easier. What things can I do to make it easier to quit? Quitting smoking might feel very hard at first, but there is a lot that you can do to make it easier. Take these steps:  Talk to your family and friends. Ask them to support and encourage you.  Call phone quitlines, reach out to support groups, or work with a counselor.  Ask people who smoke to not smoke around you.  Avoid places that make you want (trigger) to smoke, such as: ? Bars. ? Parties. ? Smoke-break areas at work.  Spend time with people who do not smoke.  Lower the stress in your life. Stress can make you want to smoke. Try these things to help your stress: ? Getting regular exercise. ? Deep-breathing exercises. ? Yoga. ? Meditating. ? Doing a body scan. To do this, close your eyes, focus on one area of your body at a time from head to toe, and notice which parts of your body are tense. Try to relax the muscles in those areas.  Download or buy apps on your mobile phone or tablet that can help you stick to your quit plan. There are many free apps, such as QuitGuide from the CDC (Centers for Disease Control and Prevention). You can find more support from smokefree.gov and other websites.  This information is not   intended to replace advice given to you by your health care provider. Make sure you discuss any questions you have with your health care provider. Document Released: 03/17/2009 Document Revised: 01/17/2016 Document Reviewed: 10/05/2014 Elsevier Interactive Patient Education  2018 [employer].  Health Maintenance, Male A healthy lifestyle and preventive care is important for your health and wellness. Ask your health care provider about what schedule of regular examinations is right for you. What should I know about weight and diet? Eat a  Healthy Diet  Eat plenty of vegetables, fruits, whole grains, low-fat dairy products, and lean protein.  Do not eat a lot of foods high in solid fats, added sugars, or salt.  Maintain a Healthy Weight Regular exercise can help you achieve or maintain a healthy weight. You should:  Do at least 150 minutes of exercise each week. The exercise should increase your heart rate and make you sweat (moderate-intensity exercise).  Do strength-training exercises at least twice a week.  Watch Your Levels of Cholesterol and Blood Lipids  Have your blood tested for lipids and cholesterol every 5 years starting at 29 years of age. If you are at high risk for heart disease, you should start having your blood tested when you are 29 years old. You may need to have your cholesterol levels checked more often if: ? Your lipid or cholesterol levels are high. ? You are older than 29 years of age. ? You are at high risk for heart disease.  What should I know about cancer screening? Many types of cancers can be detected early and may often be prevented. Lung Cancer  You should be screened every year for lung cancer if: ? You are a current smoker who has smoked for at least 30 years. ? You are a former smoker who has quit within the past 15 years.  Talk to your health care provider about your screening options, when you should start screening, and how often you should be screened.  Colorectal Cancer  Routine colorectal cancer screening usually begins at 29 years of age and should be repeated every 5-10 years until you are 29 years old. You may need to be screened more often if early forms of precancerous polyps or small growths are found. Your health care provider may recommend screening at an earlier age if you have risk factors for colon cancer.  Your health care provider may recommend using home test kits to check for hidden blood in the stool.  A small camera at the end of a tube can be used to examine  your colon (sigmoidoscopy or colonoscopy). This checks for the earliest forms of colorectal cancer.  Prostate and Testicular Cancer  Depending on your age and overall health, your health care provider may do certain tests to screen for prostate and testicular cancer.  Talk to your health care provider about any symptoms or concerns you have about testicular or prostate cancer.  Skin Cancer  Check your skin from head to toe regularly.  Tell your health care provider about any new moles or changes in moles, especially if: ? There is a change in a mole's size, shape, or color. ? You have a mole that is larger than a pencil eraser.  Always use sunscreen. Apply sunscreen liberally and repeat throughout the day.  Protect yourself by wearing long sleeves, pants, a wide-brimmed hat, and sunglasses when outside.  What should I know about heart disease, diabetes, and high blood pressure?  If you are 18-39 years of  age, have your blood pressure checked every 3-5 years. If you are 29 years of age or older, have your blood pressure checked every year. You should have your blood pressure measured twice-once when you are at a hospital or clinic, and once when you are not at a hospital or clinic. Record the average of the two measurements. To check your blood pressure when you are not at a hospital or clinic, you can use: ? An automated blood pressure machine at a pharmacy. ? A home blood pressure monitor.  Talk to your health care provider about your target blood pressure.  If you are between 10445-29 years old, ask your health care provider if you should take aspirin to prevent heart disease.  Have regular diabetes screenings by checking your fasting blood sugar level. ? If you are at a normal weight and have a low risk for diabetes, have this test once every three years after the age of 29. ? If you are overweight and have a high risk for diabetes, consider being tested at a younger age or more  often.  A one-time screening for abdominal aortic aneurysm (AAA) by ultrasound is recommended for men aged 65-75 years who are current or former smokers. What should I know about preventing infection? Hepatitis B If you have a higher risk for hepatitis B, you should be screened for this virus. Talk with your health care provider to find out if you are at risk for hepatitis B infection. Hepatitis C Blood testing is recommended for:  Everyone born from [birth year] through 1965.  Anyone with known risk factors for hepatitis C.  Sexually Transmitted Diseases (STDs)  You should be screened each year for STDs including gonorrhea and chlamydia if: ? You are sexually active and are younger than 29 years of age. ? You are older than 29 years of age and your health care provider tells you that you are at risk for this type of infection. ? Your sexual activity has changed since you were last screened and you are at an increased risk for chlamydia or gonorrhea. Ask your health care provider if you are at risk.  Talk with your health care provider about whether you are at high risk of being infected with HIV. Your health care provider may recommend a prescription medicine to help prevent HIV infection.  What else can I do?  Schedule regular health, dental, and eye exams.  Stay current with your vaccines (immunizations).  Do not use any tobacco products, such as cigarettes, chewing tobacco, and e-cigarettes. If you need help quitting, ask your health care provider.  Limit alcohol intake to no more than 2 drinks per day. One drink equals 12 ounces of beer, 5 ounces of wine, or 1 ounces of hard liquor.  Do not use street drugs.  Do not share needles.  Ask your health care provider for help if you need support or information about quitting drugs.  Tell your health care provider if you often feel depressed.  Tell your health care provider if you have ever been abused or do not feel safe at  home. This information is not intended to replace advice given to you by your health care provider. Make sure you discuss any questions you have with your health care provider. Document Released: 11/17/2007 Document Revised: 01/18/2016 Document Reviewed: 02/22/2015 Elsevier Interactive Patient Education  [employer]2018 Elsevier Inc.

## 2017-06-21 NOTE — Assessment & Plan Note (Signed)
Start HPV vaccine series

## 2017-06-22 ENCOUNTER — Other Ambulatory Visit: Payer: Self-pay | Admitting: Family Medicine

## 2017-06-22 DIAGNOSIS — E786 Lipoprotein deficiency: Secondary | ICD-10-CM

## 2017-06-22 NOTE — Progress Notes (Signed)
Lipid panel ordered for May, June, or July

## 2017-08-19 ENCOUNTER — Ambulatory Visit: Payer: Medicaid Other | Admitting: Family Medicine

## 2017-09-27 ENCOUNTER — Encounter: Payer: Self-pay | Admitting: Family Medicine

## 2017-09-27 ENCOUNTER — Ambulatory Visit: Payer: Medicaid Other | Admitting: Family Medicine

## 2017-09-27 VITALS — BP 122/78 | HR 79 | Temp 98.4°F | Resp 14 | Ht 68.0 in | Wt 183.8 lb

## 2017-09-27 DIAGNOSIS — M47812 Spondylosis without myelopathy or radiculopathy, cervical region: Secondary | ICD-10-CM | POA: Diagnosis not present

## 2017-09-27 DIAGNOSIS — Z23 Encounter for immunization: Secondary | ICD-10-CM

## 2017-09-27 HISTORY — DX: Spondylosis without myelopathy or radiculopathy, cervical region: M47.812

## 2017-09-27 MED ORDER — MELOXICAM 7.5 MG PO TABS: 7.5000 mg | ORAL_TABLET | Freq: Every day | ORAL | 2 refills | Status: DC

## 2017-09-27 MED ORDER — CYCLOBENZAPRINE HCL 10 MG PO TABS: 10.0000 mg | ORAL_TABLET | Freq: Every day | ORAL | 2 refills | Status: DC

## 2017-09-27 NOTE — Assessment & Plan Note (Signed)
Reviewed previous imaging; has seen ortho; has done PT; will treat with NSAID and flexeril; let me know of any changes, new neurologic symptoms
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
## 2017-09-27 NOTE — Patient Instructions (Signed)
Use the muscle relaxer at night if needed Use the meloxicam as a pain / anti-inflammatory If you need something else for aches or pains, try to use Tylenol (acetaminophen) instead of non-steroidals (which include Aleve, ibuprofen, Advil, Motrin, and naproxen) Let me know if any new symptoms appear or your pain is not controlled

## 2018-01-24 ENCOUNTER — Ambulatory Visit (INDEPENDENT_AMBULATORY_CARE_PROVIDER_SITE_OTHER): Payer: PRIVATE HEALTH INSURANCE | Admitting: Family Medicine

## 2018-01-24 ENCOUNTER — Encounter: Payer: Self-pay | Admitting: Family Medicine

## 2018-01-24 ENCOUNTER — Other Ambulatory Visit (HOSPITAL_COMMUNITY)
Admission: RE | Admit: 2018-01-24 | Discharge: 2018-01-24 | Disposition: A | Discharge status: Home / Self Care | Payer: PRIVATE HEALTH INSURANCE | Source: Ambulatory Visit | Attending: Family Medicine | Admitting: Family Medicine

## 2018-01-24 VITALS — BP 120/68 | HR 82 | Temp 98.6°F | Resp 14 | Ht 68.0 in | Wt 187.9 lb

## 2018-01-24 DIAGNOSIS — Z23 Encounter for immunization: Secondary | ICD-10-CM | POA: Diagnosis not present

## 2018-01-24 DIAGNOSIS — Z113 Encounter for screening for infections with a predominantly sexual mode of transmission: Secondary | ICD-10-CM | POA: Insufficient documentation

## 2018-01-24 DIAGNOSIS — E786 Lipoprotein deficiency: Secondary | ICD-10-CM

## 2018-01-24 DIAGNOSIS — D582 Other hemoglobinopathies: Secondary | ICD-10-CM

## 2018-01-24 DIAGNOSIS — Z720 Tobacco use: Secondary | ICD-10-CM | POA: Diagnosis not present

## 2018-01-24 DIAGNOSIS — R17 Unspecified jaundice: Secondary | ICD-10-CM

## 2018-01-24 DIAGNOSIS — E663 Overweight: Secondary | ICD-10-CM

## 2018-01-24 NOTE — Progress Notes (Signed)
BP 120/68   Pulse 82   Temp 98.6 F (37 C) (Oral)   Resp 14   Ht 5\' 8"  (1.727 m)   Wt 187 lb 14.4 oz (85.2 kg)   SpO2 95%   BMI 28.57 kg/m    Subjective:    Patient ID: Douglas Greer, male    DOB: 26-Sep-1990, 27 y.o.   MRN: [id number]  HPI: Douglas Greer is a 27 y.o. male  Chief Complaint  Patient presents with  . Follow-up    HPI Patient is here for f/u  He has neck pain; saw orthopaedist, wants him to PT and he's thinking about  High bilirubin; no abdominal pain; no nausea; good appetite; no jaundice  High hemoglobin; may be related to smoking  Tobacco abuse; smoking 1 ppd, down from 1.5 ppd; just saying no; he thinks about quitting; has not been able to quit before; another smoker in the home; smoking since 27 years old; starting to feel the effects; he has been on Chantix before and didn't really help  Low HDL; father has heart issues; he tries to exercise, walking  [facility] 01/24/2018 09/27/2017 06/21/2017 02/25/2017 11/25/2015  Decreased Interest 0 0 0 0 0  Down, Depressed, Hopeless 0 0 0 0 0  PHQ - 2 Score 0 0 0 0 0    Relevant past medical, surgical, family and social history reviewed Past Medical History:  Diagnosis Date  . Low HDL (under 40) 11/26/2015  . Tobacco use 11/27/2015   Past Surgical History:  Procedure Laterality Date  . INCISIONAL HERNIA REPAIR    . NASAL FRACTURE SURGERY     Family History  Problem Relation Age of Onset  . Hypertension Mother   . Diabetes Father   . Asthma Son   . Allergies Son        peanuts   Social History   Tobacco Use  . Smoking status: Current Every Day Smoker    Packs/day: 1.00    Types: Cigarettes  . Smokeless tobacco: Never Used  Substance Use Topics  . Alcohol use: Yes    Alcohol/week: 0.0 standard drinks    Comment: occassional  . Drug use: Yes    Types: Marijuana    Interim medical history since last visit reviewed. Allergies and medications reviewed  Review of  Systems Per HPI unless specifically indicated above     Objective:    BP 120/68   Pulse 82   Temp 98.6 F (37 C) (Oral)   Resp 14   Ht 5\' 8"  (1.727 m)   Wt 187 lb 14.4 oz (85.2 kg)   SpO2 95%   BMI 28.57 kg/m   Wt Readings from Last 3 Encounters:  01/24/18 187 lb 14.4 oz (85.2 kg)  09/27/17 183 lb 12.8 oz (83.4 kg)  06/21/17 178 lb 4.8 oz (80.9 kg)    Physical Exam  Constitutional: He appears well-developed and well-nourished. No distress.  HENT:  Head: Normocephalic and atraumatic.  Eyes: EOM are normal. No scleral icterus.  Neck: No thyromegaly present.  Cardiovascular: Normal rate and regular rhythm.  Pulmonary/Chest: Effort normal and breath sounds normal.  Abdominal: Soft. Bowel sounds are normal. He exhibits no distension.  Musculoskeletal: He exhibits no edema.  Neurological: Coordination normal.  Skin: Skin is warm and dry. No pallor.  Psychiatric: He has a normal mood and affect. His behavior is normal. Judgment and thought content normal.    Results for orders placed or performed in visit on 06/21/17  CBC with Differential/Platelet  Result Value Ref Range   WBC 10.7 3.8 - 10.8 Thousand/uL   RBC 5.37 4.20 - 5.80 Million/uL   Hemoglobin 18.0 (H) 13.2 - 17.1 g/dL   HCT 16.1 (H) 09.6 - 04.5 %   MCV 94.2 80.0 - 100.0 fL   MCH 33.5 (H) 27.0 - 33.0 pg   MCHC 35.6 32.0 - 36.0 g/dL   RDW 40.9 81.1 - 91.4 %   Platelets 161 140 - 400 Thousand/uL   MPV 12.2 7.5 - 12.5 fL   Neutro Abs 7,233 1,500 - 7,800 cells/uL   Lymphs Abs 2,525 850 - 3,900 cells/uL   WBC mixed population 706 200 - 950 cells/uL   Eosinophils Absolute 193 15 - 500 cells/uL   Basophils Absolute 43 0 - 200 cells/uL   Neutrophils Relative % 67.6 %   Total Lymphocyte 23.6 %   Monocytes Relative 6.6 %   Eosinophils Relative 1.8 %   Basophils Relative 0.4 %  COMPLETE METABOLIC PANEL WITH GFR  Result Value Ref Range   Glucose, Bld 80 65 - 99 mg/dL   BUN 8 7 - 25 mg/dL   Creat 7.82 9.56 - 2.13  mg/dL   GFR, Est Non African American 82 > OR = 60 mL/min/1.78m2   GFR, Est African American 95 > OR = 60 mL/min/1.61m2   BUN/Creatinine Ratio NOT APPLICABLE 6 - 22 (calc)   Sodium 141 135 - 146 mmol/L   Potassium 4.2 3.5 - 5.3 mmol/L   Chloride 107 98 - 110 mmol/L   CO2 25 20 - 32 mmol/L   Calcium 9.6 8.6 - 10.3 mg/dL   Total Protein 7.0 6.1 - 8.1 g/dL   Albumin 4.7 3.6 - 5.1 g/dL   Globulin 2.3 1.9 - 3.7 g/dL (calc)   AG Ratio 2.0 1.0 - 2.5 (calc)   Total Bilirubin 0.8 0.2 - 1.2 mg/dL   Alkaline phosphatase (APISO) 89 40 - 115 U/L   AST 20 10 - 40 U/L   ALT 35 9 - 46 U/L  Lipid panel  Result Value Ref Range   Cholesterol 169 <200 mg/dL   HDL 16 (L) >08 mg/dL   Triglycerides 657 <846 mg/dL   LDL Cholesterol (Calc) 128 (H) mg/dL (calc)   Total CHOL/HDL Ratio 10.6 (H) <5.0 (calc)   Non-HDL Cholesterol (Calc) 153 (H) <130 mg/dL (calc)  TSH  Result Value Ref Range   TSH 0.81 0.40 - 4.50 mIU/L      Assessment & Plan:   Problem List Items Addressed This Visit      Other   Total bilirubin, elevated   Relevant Orders   COMPLETE METABOLIC PANEL WITH GFR   Bilirubin, Direct   Need for HPV vaccination   Relevant Orders   HPV 9-valent vaccine,Recombinat (Completed)   Tobacco use    Encouraged cessation      Overweight   Low HDL (under 40)    Check lipids today; low HDL increases risk of heart attack; smoking cessation will help raise the HDL      Relevant Orders   Lipid panel   Elevated hemoglobin (HCC) - Primary    Check H/H      Relevant Orders   CBC with Differential/Platelet    Other Visit Diagnoses    Screen for STD (sexually transmitted disease)       Relevant Orders   Hepatitis, Acute   RPR   HIV antibody (with reflex)   Urine cytology ancillary only  Follow up plan: Return in about 6 months (around 07/27/2018) for follow-up visit with Dr. Sherie Don.  An after-visit summary was printed and given to the patient at check-out.  Please see the patient  instructions which may contain other information and recommendations beyond what is mentioned above in the assessment and plan.  No orders of the defined types were placed in this encounter.   Orders Placed This Encounter  Procedures  . HPV 9-valent vaccine,Recombinat  . CBC with Differential/Platelet  . COMPLETE METABOLIC PANEL WITH GFR  . Lipid panel  . Bilirubin, Direct  . Hepatitis, Acute  . RPR  . HIV antibody (with reflex)

## 2018-01-24 NOTE — Patient Instructions (Addendum)
I do encourage you to quit smoking Call [phone] to sign up for smoking cessation classes You can call 1-800-QUIT-NOW to talk with a smoking cessation coach   Health Risks of Smoking Smoking cigarettes is very bad for your health. Tobacco smoke has over 200 known poisons in it. It contains the poisonous gases nitrogen oxide and carbon monoxide. There are over 60 chemicals in tobacco smoke that cause cancer. Smoking is difficult to quit because a chemical in tobacco, called nicotine, causes addiction or dependence. When you smoke and inhale, nicotine is absorbed rapidly into the bloodstream through your lungs. Both inhaled and non-inhaled nicotine may be addictive. What are the risks of cigarette smoke? Cigarette smokers have an increased risk of many serious medical problems, including:  Lung cancer.  Lung disease, such as pneumonia, bronchitis, and emphysema.  Chest pain (angina) and heart attack because the heart is not getting enough oxygen.  Heart disease and peripheral blood vessel disease.  High blood pressure (hypertension).  Stroke.  Oral cancer, including cancer of the lip, mouth, or voice box.  Bladder cancer.  Pancreatic cancer.  Cervical cancer.  Pregnancy complications, including premature birth.  Stillbirths and smaller newborn babies, birth defects, and genetic damage to sperm.  Early menopause.  Lower estrogen level for women.  Infertility.  Facial wrinkles.  Blindness.  Increased risk of broken bones (fractures).  Senile dementia.  Stomach ulcers and internal bleeding.  Delayed wound healing and increased risk of complications during surgery.  Even smoking lightly shortens your life expectancy by several years.  Because of secondhand smoke exposure, children of smokers have an increased risk of the following:  Sudden infant death syndrome (SIDS).  Respiratory infections.  Lung cancer.  Heart disease.  Ear infections.  What are  the benefits of quitting? There are many health benefits of quitting smoking. Here are some of them:  Within days of quitting smoking, your risk of having a heart attack decreases, your blood flow improves, and your lung capacity improves. Blood pressure, pulse rate, and breathing patterns start returning to normal soon after quitting.  Within months, your lungs may clear up completely.  Quitting for 10 years reduces your risk of developing lung cancer and heart disease to almost that of a nonsmoker.  People who quit may see an improvement in their overall quality of life.  How do I quit smoking? Smoking is an addiction with both physical and psychological effects, and longtime habits can be hard to change. Your health care provider can recommend:  Programs and community resources, which may include group support, education, or talk therapy.  Prescription medicines to help reduce cravings.  Nicotine replacement products, such as patches, gum, and nasal sprays. Use these products only as directed. Do not replace cigarette smoking with electronic cigarettes, which are commonly called e-cigarettes. The safety of e-cigarettes is not known, and some may contain harmful chemicals.  A combination of two or more of these methods.  Where to find more information:  American Lung Association: www.lung.org  American Cancer Society: www.cancer.org Summary  Smoking cigarettes is very bad for your health. Cigarette smokers have an increased risk of many serious medical problems, including several cancers, heart disease, and stroke.  Smoking is an addiction with both physical and psychological effects, and longtime habits can be hard to change.  By stopping right away, you can greatly reduce the risk of medical problems for you and your family.  To help you quit smoking, your health care provider can recommend programs,   community resources, prescription medicines, and nicotine replacement products  such as patches, gum, and nasal sprays. This information is not intended to replace advice given to you by your health care provider. Make sure you discuss any questions you have with your health care provider. Document Released: 06/28/2004 Document Revised: 05/25/2016 Document Reviewed: 05/25/2016 Elsevier Interactive Patient Education  2017 [employer]  Steps to Quit Smoking Smoking tobacco can be bad for your health. It can also affect almost every organ in your body. Smoking puts you and people around you at risk for many serious long-lasting (chronic) diseases. Quitting smoking is hard, but it is one of the best things that you can do for your health. It is never too late to quit. What are the benefits of quitting smoking? When you quit smoking, you lower your risk for getting serious diseases and conditions. They can include:  Lung cancer or lung disease.  Heart disease.  Stroke.  Heart attack.  Not being able to have children (infertility).  Weak bones (osteoporosis) and broken bones (fractures).  If you have coughing, wheezing, and shortness of breath, those symptoms may get better when you quit. You may also get sick less often. If you are pregnant, quitting smoking can help to lower your chances of having a baby of low birth weight. What can I do to help me quit smoking? Talk with your doctor about what can help you quit smoking. Some things you can do (strategies) include:  Quitting smoking totally, instead of slowly cutting back how much you smoke over a period of time.  Going to in-person counseling. You are more likely to quit if you go to many counseling sessions.  Using resources and support systems, such as: ? Agricultural engineernline chats with a Veterinary surgeoncounselor. ? Phone quitlines. ? Automotive engineerrinted self-help materials. ? Support groups or group counseling. ? Text messaging programs. ? Mobile phone apps or applications.  Taking medicines. Some of these medicines may have nicotine in them.  If you are pregnant or breastfeeding, do not take any medicines to quit smoking unless your doctor says it is okay. Talk with your doctor about counseling or other things that can help you.  Talk with your doctor about using more than one strategy at the same time, such as taking medicines while you are also going to in-person counseling. This can help make quitting easier. What things can I do to make it easier to quit? Quitting smoking might feel very hard at first, but there is a lot that you can do to make it easier. Take these steps:  Talk to your family and friends. Ask them to support and encourage you.  Call phone quitlines, reach out to support groups, or work with a Veterinary surgeoncounselor.  Ask people who smoke to not smoke around you.  Avoid places that make you want (trigger) to smoke, such as: ? Bars. ? Parties. ? Smoke-break areas at work.  Spend time with people who do not smoke.  Lower the stress in your life. Stress can make you want to smoke. Try these things to help your stress: ? Getting regular exercise. ? Deep-breathing exercises. ? Yoga. ? Meditating. ? Doing a body scan. To do this, close your eyes, focus on one area of your body at a time from head to toe, and notice which parts of your body are tense. Try to relax the muscles in those areas.  Download or buy apps on your mobile phone or tablet that can help you stick to your  quit plan. There are many free apps, such as QuitGuide from the [employer] developer for Disease Control and Prevention). You can find more support from smokefree.gov and other websites.  This information is not intended to replace advice given to you by your health care provider. Make sure you discuss any questions you have with your health care provider. Document Released: 03/17/2009 Document Revised: 01/17/2016 Document Reviewed: 10/05/2014 Elsevier Interactive Patient Education  2018 Elsevier Inc.  Preventing Unhealthy [employer], Adult Staying at a  healthy weight is important. When fat builds up in your body, you may become overweight or obese. These conditions put you at greater risk for developing certain health problems, such as heart disease, diabetes, sleeping problems, joint problems, and some cancers. Unhealthy weight gain is often the result of making unhealthy choices in what you eat. It is also a result of not getting enough exercise. You can make changes to your lifestyle to prevent obesity and stay as healthy as possible. What nutrition changes can be made? To maintain a healthy weight and prevent obesity:  Eat only as much as your body needs. To do this: ? Pay attention to signs that you are hungry or full. Stop eating as soon as you feel full. ? If you feel hungry, try drinking water first. Drink enough water so your urine is clear or pale yellow. ? Eat smaller portions. ? Look at serving sizes on food labels. Most foods contain more than one serving per container. ? Eat the recommended amount of calories for your gender and activity level. While most active people should eat around 2,000 calories per day, if you are trying to lose weight or are not very active, you main need to eat less calories. Talk to your health care provider or dietitian about how many calories you should eat each day.  Choose healthy foods, such as: ? Fruits and vegetables. Try to fill at least half of your plate at each meal with fruits and vegetables. ? Whole grains, such as whole wheat bread, brown rice, and quinoa. ? Lean meats, such as chicken or fish. ? Other healthy proteins, such as beans, eggs, or tofu. ? Healthy fats, such as nuts, seeds, fatty fish, and olive oil. ? Low-fat or fat-free dairy.  Check food labels and avoid food and drinks that: ? Are high in calories. ? Have added sugar. ? Are high in sodium. ? Have saturated fats or trans fats.  Limit how much you eat of the following foods: ? Prepackaged meals. ? Fast food. ? Fried  foods. ? Processed meat, such as bacon, sausage, and deli meats. ? Fatty cuts of red meat and poultry with skin.  Cook foods in healthier ways, such as by baking, broiling, or grilling.  When grocery shopping, try to shop around the outside of the store. This helps you buy mostly fresh foods and avoid canned and prepackaged foods.  What lifestyle changes can be made?  Exercise at least 30 minutes 5 or more days each week. Exercising includes brisk walking, yard work, biking, running, swimming, and team sports like basketball and soccer. Ask your health care provider which exercises are safe for you.  Do not use any products that contain nicotine or tobacco, such as cigarettes and e-cigarettes. If you need help quitting, ask your health care provider.  Limit alcohol intake to no more than 1 drink a day for nonpregnant women and 2 drinks a day for men. One drink equals 12 oz of beer, 5 oz  of wine, or 1 oz of hard liquor.  Try to get 7-9 hours of sleep each night. What other changes can be made?  Keep a food and activity journal to keep track of: ? What you ate and how many calories you had. Remember to count sauces, dressings, and side dishes. ? Whether you were active, and what exercises you did. ? Your calorie, weight, and activity goals.  Check your weight regularly. Track any changes. If you notice you have gained weight, make changes to your diet or activity routine.  Avoid taking weight-loss medicines or supplements. Talk to your health care provider before starting any new medicine or supplement.  Talk to your health care provider before trying any new diet or exercise plan. Why are these changes important? Eating healthy, staying active, and having healthy habits not only help prevent obesity, they also:  Help you to manage stress and emotions.  Help you to connect with friends and family.  Improve your self-esteem.  Improve your sleep.  Prevent long-term health  problems.  What can happen if changes are not made? Being obese or overweight can cause you to develop joint or bone problems, which can make it hard for you to stay active or do activities you enjoy. Being obese or overweight also puts stress on your heart and lungs and can lead to health problems like diabetes, heart disease, and some cancers. Where to find more information: Talk with your health care provider or a dietitian about healthy eating and healthy lifestyle choices. You may also find other information through these resources:  U.S. Department of Agriculture MyPlate: https://ball-collins.biz/  American Heart Association: www.heart.org  Centers for Disease Control and Prevention: FootballExhibition.com.br  Summary  Staying at a healthy weight is important. It helps prevent certain diseases and health problems, such as heart disease, diabetes, joint problems, sleep disorders, and some cancers.  Being obese or overweight can cause you to develop joint or bone problems, which can make it hard for you to stay active or do activities you enjoy.  You can prevent unhealthy weight gain by eating a healthy diet, exercising regularly, not smoking, limiting alcohol, and getting enough sleep.  Talk with your health care provider or a dietitian for guidance about healthy eating and healthy lifestyle choices. This information is not intended to replace advice given to you by your health care provider. Make sure you discuss any questions you have with your health care provider. Document Released: 05/22/2016 Document Revised: 06/27/2016 Document Reviewed: 06/27/2016 Elsevier Interactive Patient Education  [employer].

## 2018-01-24 NOTE — Assessment & Plan Note (Signed)
Encouraged cessation.

## 2018-01-24 NOTE — Assessment & Plan Note (Addendum)
Check lipids today; low HDL increases risk of heart attack; smoking cessation will help raise the HDL

## 2018-01-24 NOTE — Assessment & Plan Note (Signed)
Check HH

## 2018-01-27 LAB — COMPLETE METABOLIC PANEL WITH GFR
AG RATIO: 1.6 (calc) (ref 1.0–2.5)
ALBUMIN MSPROF: 4.5 g/dL (ref 3.6–5.1)
ALT: 44 U/L (ref 9–46)
AST: 27 U/L (ref 10–40)
Alkaline phosphatase (APISO): 78 U/L (ref 40–115)
BUN: 9 mg/dL (ref 7–25)
CALCIUM: 9.8 mg/dL (ref 8.6–10.3)
CO2: 23 mmol/L (ref 20–32)
Chloride: 106 mmol/L (ref 98–110)
Creat: 1.16 mg/dL (ref 0.60–1.35)
GFR, EST NON AFRICAN AMERICAN: 85 mL/min/{1.73_m2} (ref >=60)
GFR, Est African American: 99 mL/min/{1.73_m2} (ref >=60)
GLOBULIN: 2.9 g/dL (ref 1.9–3.7)
Glucose, Bld: 86 mg/dL (ref 65–99)
POTASSIUM: 3.8 mmol/L (ref 3.5–5.3)
SODIUM: 140 mmol/L (ref 135–146)
Total Bilirubin: 1.2 mg/dL (ref 0.2–1.2)
Total Protein: 7.4 g/dL (ref 6.1–8.1)

## 2018-01-27 LAB — URINE CYTOLOGY ANCILLARY ONLY
Chlamydia: NEGATIVE
Neisseria Gonorrhea: NEGATIVE
Trichomonas: NEGATIVE

## 2018-01-27 LAB — HEPATITIS PANEL, ACUTE
Hep A IgM: NONREACTIVE
Hep B C IgM: NONREACTIVE
Hepatitis B Surface Ag: NONREACTIVE
Hepatitis C Ab: NONREACTIVE
SIGNAL TO CUT-OFF: 0.02 (ref <1.00)

## 2018-01-27 LAB — CBC WITH DIFFERENTIAL/PLATELET
BASOS ABS: 36 {cells}/uL (ref 0–200)
Basophils Relative: 0.3 %
Eosinophils Absolute: 202 cells/uL (ref 15–500)
Eosinophils Relative: 1.7 %
HEMATOCRIT: 49.9 % (ref 38.5–50.0)
HEMOGLOBIN: 17.4 g/dL — AB (ref 13.2–17.1)
LYMPHS ABS: 4070 {cells}/uL — AB (ref 850–3900)
MCH: 32.8 pg (ref 27.0–33.0)
MCHC: 34.9 g/dL (ref 32.0–36.0)
MCV: 94 fL (ref 80.0–100.0)
MPV: 12.7 fL — ABNORMAL HIGH (ref 7.5–12.5)
Monocytes Relative: 6 %
Neutro Abs: 6878 cells/uL (ref 1500–7800)
Neutrophils Relative %: 57.8 %
Platelets: 172 10*3/uL (ref 140–400)
RBC: 5.31 10*6/uL (ref 4.20–5.80)
RDW: 12.9 % (ref 11.0–15.0)
Total Lymphocyte: 34.2 %
WBC: 11.9 10*3/uL — ABNORMAL HIGH (ref 3.8–10.8)
WBCMIX: 714 {cells}/uL (ref 200–950)

## 2018-01-27 LAB — LIPID PANEL
CHOL/HDL RATIO: 9.6 (calc) — AB (ref <5.0)
Cholesterol: 173 mg/dL (ref <200)
HDL: 18 mg/dL — AB (ref >40)
LDL Cholesterol (Calc): 120 mg/dL (calc) — ABNORMAL HIGH
NON-HDL CHOLESTEROL (CALC): 155 mg/dL — AB (ref <130)
Triglycerides: 231 mg/dL — ABNORMAL HIGH (ref <150)

## 2018-01-27 LAB — HIV ANTIBODY (ROUTINE TESTING W REFLEX): HIV: NONREACTIVE

## 2018-01-27 LAB — BILIRUBIN, DIRECT: BILIRUBIN DIRECT: 0.2 mg/dL (ref 0.0–0.2)

## 2018-01-27 LAB — RPR: RPR: NONREACTIVE

## 2018-04-10 DIAGNOSIS — M47812 Spondylosis without myelopathy or radiculopathy, cervical region: Secondary | ICD-10-CM | POA: Insufficient documentation

## 2018-07-29 ENCOUNTER — Ambulatory Visit: Payer: PRIVATE HEALTH INSURANCE | Admitting: Family Medicine

## 2018-08-11 ENCOUNTER — Encounter: Payer: Self-pay | Admitting: Family Medicine

## 2018-08-11 ENCOUNTER — Ambulatory Visit (INDEPENDENT_AMBULATORY_CARE_PROVIDER_SITE_OTHER): Payer: PRIVATE HEALTH INSURANCE | Admitting: Family Medicine

## 2018-08-11 ENCOUNTER — Other Ambulatory Visit: Payer: Self-pay | Admitting: Family Medicine

## 2018-08-11 ENCOUNTER — Other Ambulatory Visit (HOSPITAL_COMMUNITY)
Admission: RE | Admit: 2018-08-11 | Discharge: 2018-08-11 | Disposition: A | Discharge status: Home / Self Care | Payer: PRIVATE HEALTH INSURANCE | Source: Ambulatory Visit | Attending: Family Medicine | Admitting: Family Medicine

## 2018-08-11 VITALS — BP 116/70 | HR 86 | Temp 98.6°F | Resp 12 | Ht 68.0 in | Wt 195.3 lb

## 2018-08-11 DIAGNOSIS — Z113 Encounter for screening for infections with a predominantly sexual mode of transmission: Secondary | ICD-10-CM | POA: Insufficient documentation

## 2018-08-11 DIAGNOSIS — Z720 Tobacco use: Secondary | ICD-10-CM | POA: Diagnosis not present

## 2018-08-11 DIAGNOSIS — E663 Overweight: Secondary | ICD-10-CM | POA: Diagnosis not present

## 2018-08-11 DIAGNOSIS — E786 Lipoprotein deficiency: Secondary | ICD-10-CM | POA: Diagnosis not present

## 2018-08-11 DIAGNOSIS — M47812 Spondylosis without myelopathy or radiculopathy, cervical region: Secondary | ICD-10-CM

## 2018-08-11 DIAGNOSIS — R17 Unspecified jaundice: Secondary | ICD-10-CM | POA: Diagnosis not present

## 2018-08-11 DIAGNOSIS — D582 Other hemoglobinopathies: Secondary | ICD-10-CM

## 2018-08-11 DIAGNOSIS — Q383 Other congenital malformations of tongue: Secondary | ICD-10-CM

## 2018-08-11 NOTE — Progress Notes (Signed)
Refer to hematologist Likely secondary to smoking, but referral entered

## 2018-08-11 NOTE — Assessment & Plan Note (Signed)
Encouraged weight loss; see AVS 

## 2018-08-11 NOTE — Assessment & Plan Note (Signed)
Encouraged complete cessation; see AVS

## 2018-08-11 NOTE — Assessment & Plan Note (Signed)
Check bili today; possible Gilbert's

## 2018-08-11 NOTE — Assessment & Plan Note (Signed)
Check today 

## 2018-08-11 NOTE — Assessment & Plan Note (Signed)
Managed by specialist at [facility]

## 2018-08-11 NOTE — Assessment & Plan Note (Signed)
Encouraged weight loss, smoking cessation, and exercise; build up slowly; those will help HDL; low HDL is associated with heart attacks I explained

## 2018-08-11 NOTE — Assessment & Plan Note (Addendum)
Check CBC; most likely from smoking; if still elevated, consider referral to hematologist; encouraged cessation
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
## 2018-08-11 NOTE — Patient Instructions (Addendum)
Start slowly and build up gradually on the exercise  I do encourage you to quit smoking Call [phone] to sign up for smoking cessation classes You can call 1-800-QUIT-NOW to talk with a smoking cessation coach Consider giving up one cigarette more each day and you can quit smoking in 3 weeks  We'll get labs today If you have not heard anything from my staff in a week about any orders/referrals/studies from today, please contact us here to follow-up [phone]  I'll suggest that you see an Ear Nose Throat doctor, and I'll be glad to do that through [facility] Smoking does increase the risk of head and neck and oral cancers Please let me know soon   Health Risks of Smoking Smoking cigarettes is very bad for your health. Tobacco smoke has over 200 known poisons in it. It contains the poisonous gases nitrogen oxide and carbon monoxide. There are over 60 chemicals in tobacco smoke that cause cancer. Smoking is difficult to quit because a chemical in tobacco, called nicotine, causes addiction or dependence. When you smoke and inhale, nicotine is absorbed rapidly into the bloodstream through your lungs. Both inhaled and non-inhaled nicotine may be addictive. What are the risks of cigarette smoke? Cigarette smokers have an increased risk of many serious medical problems, including:  Lung cancer.  Lung disease, such as pneumonia, bronchitis, and emphysema.  Chest pain (angina) and heart attack because the heart is not getting enough oxygen.  Heart disease and peripheral blood vessel disease.  High blood pressure (hypertension).  Stroke.  Oral cancer, including cancer of the lip, mouth, or voice box.  Bladder cancer.  Pancreatic cancer.  Cervical cancer.  Pregnancy complications, including premature birth.  Stillbirths and smaller newborn babies, birth defects, and genetic damage to sperm.  Early menopause.  Lower estrogen level for women.  Infertility.  Facial  wrinkles.  Blindness.  Increased risk of broken bones (fractures).  Senile dementia.  Stomach ulcers and internal bleeding.  Delayed wound healing and increased risk of complications during surgery.  Even smoking lightly shortens your life expectancy by several years. Because of secondhand smoke exposure, children of smokers have an increased risk of the following:  Sudden infant death syndrome (SIDS).  Respiratory infections.  Lung cancer.  Heart disease.  Ear infections. What are the benefits of quitting? There are many health benefits of quitting smoking. Here are some of them:  Within days of quitting smoking, your risk of having a heart attack decreases, your blood flow improves, and your lung capacity improves. Blood pressure, pulse rate, and breathing patterns start returning to normal soon after quitting.  Within months, your lungs may clear up completely.  Quitting for 10 years reduces your risk of developing lung cancer and heart disease to almost that of a nonsmoker.  People who quit may see an improvement in their overall quality of life. How do I quit smoking?     Smoking is an addiction with both physical and psychological effects, and longtime habits can be hard to change. Your health care provider can recommend:  Programs and community resources, which may include group support, education, or talk therapy.  Prescription medicines to help reduce cravings.  Nicotine replacement products, such as patches, gum, and nasal sprays. Use these products only as directed. Do not replace cigarette smoking with electronic cigarettes, which are commonly called e-cigarettes. The safety of e-cigarettes is not known, and some may contain harmful chemicals.  A combination of two or more of these methods.  Where to find more information  American Lung Association: www.lung.org  American Cancer Society: www.cancer.org Summary  Smoking cigarettes is very bad for your  health. Cigarette smokers have an increased risk of many serious medical problems, including several cancers, heart disease, and stroke.  Smoking is an addiction with both physical and psychological effects, and longtime habits can be hard to change.  By stopping right away, you can greatly reduce the risk of medical problems for you and your family.  To help you quit smoking, your health care provider can recommend programs, community resources, prescription medicines, and nicotine replacement products such as patches, gum, and nasal sprays. This information is not intended to replace advice given to you by your health care provider. Make sure you discuss any questions you have with your health care provider. Document Released: 06/28/2004 Document Revised: 08/22/2017 Document Reviewed: 05/25/2016 Elsevier Interactive Patient Education  2019 [employer].  Steps to Quit Smoking  Smoking tobacco can be bad for your health. It can also affect almost every organ in your body. Smoking puts you and people around you at risk for many serious long-lasting (chronic) diseases. Quitting smoking is hard, but it is one of the best things that you can do for your health. It is never too late to quit. What are the benefits of quitting smoking? When you quit smoking, you lower your risk for getting serious diseases and conditions. They can include:  Lung cancer or lung disease.  Heart disease.  Stroke.  Heart attack.  Not being able to have children (infertility).  Weak bones (osteoporosis) and broken bones (fractures). If you have coughing, wheezing, and shortness of breath, those symptoms may get better when you quit. You may also get sick less often. If you are pregnant, quitting smoking can help to lower your chances of having a baby of low birth weight. What can I do to help me quit smoking? Talk with your doctor about what can help you quit smoking. Some things you can do (strategies)  include:  Quitting smoking totally, instead of slowly cutting back how much you smoke over a period of time.  Going to in-person counseling. You are more likely to quit if you go to many counseling sessions.  Using resources and support systems, such as: ? Agricultural engineer with a Veterinary surgeon. ? Phone quitlines. ? Automotive engineer. ? Support groups or group counseling. ? Text messaging programs. ? Mobile phone apps or applications.  Taking medicines. Some of these medicines may have nicotine in them. If you are pregnant or breastfeeding, do not take any medicines to quit smoking unless your doctor says it is okay. Talk with your doctor about counseling or other things that can help you. Talk with your doctor about using more than one strategy at the same time, such as taking medicines while you are also going to in-person counseling. This can help make quitting easier. What things can I do to make it easier to quit? Quitting smoking might feel very hard at first, but there is a lot that you can do to make it easier. Take these steps:  Talk to your family and friends. Ask them to support and encourage you.  Call phone quitlines, reach out to support groups, or work with a Veterinary surgeon.  Ask people who smoke to not smoke around you.  Avoid places that make you want (trigger) to smoke, such as: ? Bars. ? Parties. ? Smoke-break areas at work.  Spend time with people who do not  smoke.  Lower the stress in your life. Stress can make you want to smoke. Try these things to help your stress: ? Getting regular exercise. ? Deep-breathing exercises. ? Yoga. ? Meditating. ? Doing a body scan. To do this, close your eyes, focus on one area of your body at a time from head to toe, and notice which parts of your body are tense. Try to relax the muscles in those areas.  Download or buy apps on your mobile phone or tablet that can help you stick to your quit plan. There are many free apps, such  as QuitGuide from the [employer] developer for Disease Control and Prevention). You can find more support from smokefree.gov and other websites. This information is not intended to replace advice given to you by your health care provider. Make sure you discuss any questions you have with your health care provider. Document Released: 03/17/2009 Document Revised: 01/17/2016 Document Reviewed: 10/05/2014 Elsevier Interactive Patient Education  2019 Elsevier Inc.  Preventing Health Risks of Being Overweight Maintaining a healthy body weight is an important part of your overall health. Your healthy body weight depends on your age, gender, and height. Being overweight puts you at risk for many health problems, including:  Heart disease.  Diabetes.  Problems sleeping.  Joint problems. You can make changes to your diet and lifestyle to prevent these risks. Consider working with a health care provider or a dietitian to make these changes. What nutrition changes can be made?   Eat only as much as your body needs. In most cases, this is about 2,000 calories a day, but the amount varies depending on your height, gender, and activity level. Ask your health care provider how many calories you should have each day. Eating more than your body needs on a regular basis can cause you to become overweight or obese.  Eat slowly, and stop eating when you feel full.  Choose healthy foods, including: ? Fruits and vegetables. ? Lean meats. ? Low-fat dairy products. ? High-fiber foods, such as whole grains and beans. ? Healthy snacks like vegetable sticks, a piece of fruit, or a small amount of yogurt or cheese.  Avoid foods and drinks that are high in sugar, salt (sodium), saturated fat, or trans fat. This includes: ? Many desserts such as candy, cookies, and ice cream. ? Soda. ? Fried foods. ? Processed meats such as hot dogs or lunch meats. ? Prepackaged snack foods. What lifestyle changes can be  made?   Exercise for at least 150 minutes a week to prevent weight gain, or as often as recommended by your health care provider. Do moderate-intensity exercise, such as brisk walking. ? Spread it out by exercising for 30 minutes 5 days a week, or in short 10-minute bursts several times a day.  Find other ways to stay active and burn calories, such as yard work or a hobby that involves physical activity.  Get at least 8 hours of sleep each night. When you are well-rested, you are more likely to be active and make healthy choices during the day. To sleep better: ? Try to go to bed and wake up at about the same time every day. ? Keep your bedroom dark, quiet, and cool. ? Make sure that your bed is comfortable. ? Avoid stimulating activities, such as watching television or exercising, for at least one hour before bedtime. Why are these changes important? Eating healthy and being active helps you lose weight and prevent health problems caused by  being overweight. Making these changes can also help you manage stress, feel better mentally, and connect with friends and family. What can happen if changes are not made? Being overweight can affect you for your entire life. You may develop joint or bone problems that make it painful or difficult for you to play sports or do activities you enjoy. Being overweight puts stress on your heart and lungs and can lead to medical problems like diabetes, heart disease, and sleeping problems. Where to find support You can get support for preventing health risks of being overweight from:  Your health care provider or a dietitian. They can provide guidance about healthy eating and healthy lifestyle choices.  Weight loss support groups, online or in-person. Where to find more information  MyPlate: https://ball-collins.biz/ ? This an online tool that provides personalized recommendations about foods to eat each day.  The Centers for Disease Control and Prevention:  AffordableScrapbook.gl ? This resource gives tips for managing weight and having an active lifestyle. Summary  To prevent unhealthy weight gain, it is important to maintain a healthy diet high in vegetables and whole grains, exercise regularly, and get at least 8 hours of sleep each night.  Making these changes helps prevent many long-term (chronic) health conditions that can shorten your life, such as diabetes, heart disease, and stroke. This information is not intended to replace advice given to you by your health care provider. Make sure you discuss any questions you have with your health care provider. Document Released: 04/17/2017 Document Revised: 04/17/2017 Document Reviewed: 04/17/2017 Elsevier Interactive Patient Education  2019 [employer].

## 2018-08-12 ENCOUNTER — Other Ambulatory Visit: Payer: Self-pay | Admitting: Family Medicine

## 2018-08-12 DIAGNOSIS — E786 Lipoprotein deficiency: Secondary | ICD-10-CM

## 2018-08-12 DIAGNOSIS — D582 Other hemoglobinopathies: Secondary | ICD-10-CM

## 2018-08-12 DIAGNOSIS — R7401 Elevation of levels of liver transaminase levels: Secondary | ICD-10-CM

## 2018-08-12 DIAGNOSIS — R740 Nonspecific elevation of levels of transaminase and lactic acid dehydrogenase [LDH]: Secondary | ICD-10-CM

## 2018-08-12 LAB — CBC
HEMATOCRIT: 49 % (ref 38.5–50.0)
HEMOGLOBIN: 17.6 g/dL — AB (ref 13.2–17.1)
MCH: 34.6 pg — AB (ref 27.0–33.0)
MCHC: 35.9 g/dL (ref 32.0–36.0)
MCV: 96.5 fL (ref 80.0–100.0)
MPV: 12 fL (ref 7.5–12.5)
Platelets: 190 10*3/uL (ref 140–400)
RBC: 5.08 10*6/uL (ref 4.20–5.80)
RDW: 13.1 % (ref 11.0–15.0)
WBC: 10.6 10*3/uL (ref 3.8–10.8)

## 2018-08-12 LAB — LIPID PANEL
CHOL/HDL RATIO: 8.8 (calc) — AB (ref <5.0)
CHOLESTEROL: 175 mg/dL (ref <200)
HDL: 20 mg/dL — AB (ref >=40)
LDL CHOLESTEROL (CALC): 125 mg/dL — AB
NON-HDL CHOLESTEROL (CALC): 155 mg/dL — AB (ref <130)
Triglycerides: 179 mg/dL — ABNORMAL HIGH (ref <150)

## 2018-08-12 LAB — COMPLETE METABOLIC PANEL WITH GFR
AG Ratio: 1.8 (calc) (ref 1.0–2.5)
ALBUMIN MSPROF: 4.7 g/dL (ref 3.6–5.1)
ALKALINE PHOSPHATASE (APISO): 73 U/L (ref 36–130)
ALT: 51 U/L — ABNORMAL HIGH (ref 9–46)
AST: 25 U/L (ref 10–40)
BUN: 9 mg/dL (ref 7–25)
CALCIUM: 9.7 mg/dL (ref 8.6–10.3)
CO2: 26 mmol/L (ref 20–32)
CREATININE: 1.17 mg/dL (ref 0.60–1.35)
Chloride: 105 mmol/L (ref 98–110)
GFR, EST NON AFRICAN AMERICAN: 84 mL/min/{1.73_m2} (ref >=60)
GFR, Est African American: 97 mL/min/{1.73_m2} (ref >=60)
GLOBULIN: 2.6 g/dL (ref 1.9–3.7)
Glucose, Bld: 70 mg/dL (ref 65–99)
Potassium: 3.8 mmol/L (ref 3.5–5.3)
SODIUM: 141 mmol/L (ref 135–146)
Total Bilirubin: 1 mg/dL (ref 0.2–1.2)
Total Protein: 7.3 g/dL (ref 6.1–8.1)

## 2018-08-12 LAB — HIV ANTIBODY (ROUTINE TESTING W REFLEX): HIV 1&2 Ab, 4th Generation: NONREACTIVE

## 2018-08-12 LAB — URINE CYTOLOGY ANCILLARY ONLY
CHLAMYDIA, DNA PROBE: NEGATIVE
NEISSERIA GONORRHEA: NEGATIVE
TRICH (WINDOWPATH): NEGATIVE

## 2018-08-12 LAB — HEPATITIS PANEL, ACUTE
Hep A IgM: NONREACTIVE
Hep B C IgM: NONREACTIVE
Hepatitis B Surface Ag: NONREACTIVE
Hepatitis C Ab: NONREACTIVE
SIGNAL TO CUT-OFF: 0.02 (ref <1.00)

## 2018-08-12 LAB — RPR: RPR: NONREACTIVE

## 2018-08-12 NOTE — Assessment & Plan Note (Signed)
recheck

## 2018-08-25 ENCOUNTER — Telehealth: Payer: Self-pay | Admitting: Family Medicine

## 2018-08-25 NOTE — Telephone Encounter (Signed)
Phone number not working.

## 2018-08-25 NOTE — Telephone Encounter (Addendum)
Please see message from Dr. Sherie Don re: ENT referral.  If wanting ENT referral, please advise will take about 1-2 months due to COVID19 pandemic.  ----- Message from Kerman Passey, MD sent at 08/11/2018  6:08 PM EDT ----- Regarding: see if patient ready for ENT referral See if patient ready for ENT referral If not, stress risk of head/neck/oral cancer again

## 2018-09-11 ENCOUNTER — Encounter: Payer: Self-pay | Admitting: *Deleted

## 2018-09-11 ENCOUNTER — Telehealth: Payer: Self-pay | Admitting: Oncology

## 2018-09-12 ENCOUNTER — Encounter: Payer: Self-pay | Admitting: *Deleted

## 2018-09-12 ENCOUNTER — Other Ambulatory Visit: Payer: Self-pay

## 2018-09-12 ENCOUNTER — Inpatient Hospital Stay: Payer: PRIVATE HEALTH INSURANCE | Attending: Oncology | Admitting: Oncology

## 2018-12-24 ENCOUNTER — Inpatient Hospital Stay
Admission: EM | Admit: 2018-12-24 | Discharge: 2018-12-25 | DRG: 872 | Disposition: A | Discharge status: Home / Self Care | Payer: PRIVATE HEALTH INSURANCE | Attending: Internal Medicine | Admitting: Internal Medicine

## 2018-12-24 ENCOUNTER — Other Ambulatory Visit: Payer: Self-pay

## 2018-12-24 ENCOUNTER — Emergency Department: Payer: PRIVATE HEALTH INSURANCE

## 2018-12-24 DIAGNOSIS — F17210 Nicotine dependence, cigarettes, uncomplicated: Secondary | ICD-10-CM | POA: Diagnosis present

## 2018-12-24 DIAGNOSIS — N179 Acute kidney failure, unspecified: Secondary | ICD-10-CM | POA: Diagnosis present

## 2018-12-24 DIAGNOSIS — E860 Dehydration: Secondary | ICD-10-CM | POA: Diagnosis present

## 2018-12-24 DIAGNOSIS — E876 Hypokalemia: Secondary | ICD-10-CM | POA: Diagnosis present

## 2018-12-24 DIAGNOSIS — A419 Sepsis, unspecified organism: Secondary | ICD-10-CM | POA: Diagnosis present

## 2018-12-24 DIAGNOSIS — A020 Salmonella enteritis: Secondary | ICD-10-CM | POA: Diagnosis present

## 2018-12-24 DIAGNOSIS — K529 Noninfective gastroenteritis and colitis, unspecified: Secondary | ICD-10-CM

## 2018-12-24 DIAGNOSIS — Z20828 Contact with and (suspected) exposure to other viral communicable diseases: Secondary | ICD-10-CM | POA: Diagnosis present

## 2018-12-24 DIAGNOSIS — R739 Hyperglycemia, unspecified: Secondary | ICD-10-CM | POA: Diagnosis present

## 2018-12-24 HISTORY — DX: Noninfective gastroenteritis and colitis, unspecified: K52.9

## 2018-12-24 LAB — GASTROINTESTINAL PANEL BY PCR, STOOL (REPLACES STOOL CULTURE)

## 2018-12-24 LAB — COMPREHENSIVE METABOLIC PANEL
ALT: 29 U/L (ref 0–44)
AST: 29 U/L (ref 15–41)
Albumin: 4.6 g/dL (ref 3.5–5.0)
Alkaline Phosphatase: 46 U/L (ref 38–126)
Anion gap: 13 (ref 5–15)
BUN: 14 mg/dL (ref 6–20)
CO2: 24 mmol/L (ref 22–32)
Calcium: 8.7 mg/dL — ABNORMAL LOW (ref 8.9–10.3)
Chloride: 98 mmol/L (ref 98–111)
Creatinine, Ser: 1.59 mg/dL — ABNORMAL HIGH (ref 0.61–1.24)
GFR calc Af Amer: >60 mL/min (ref >60)
GFR calc non Af Amer: 58 mL/min — ABNORMAL LOW (ref >60)
Glucose, Bld: 107 mg/dL — ABNORMAL HIGH (ref 70–99)
Potassium: 2.7 mmol/L — CL (ref 3.5–5.1)
Sodium: 135 mmol/L (ref 135–145)
Total Bilirubin: 2.3 mg/dL — ABNORMAL HIGH (ref 0.3–1.2)
Total Protein: 7.9 g/dL (ref 6.5–8.1)

## 2018-12-24 LAB — URINALYSIS, COMPLETE (UACMP) WITH MICROSCOPIC
Bacteria, UA: NONE SEEN
Bilirubin Urine: NEGATIVE
Glucose, UA: NEGATIVE mg/dL
Ketones, ur: 20 mg/dL — AB
Leukocytes,Ua: NEGATIVE
Nitrite: NEGATIVE
Protein, ur: 100 mg/dL — AB
Specific Gravity, Urine: 1.027 (ref 1.005–1.030)
pH: 6 (ref 5.0–8.0)

## 2018-12-24 LAB — CBC WITH DIFFERENTIAL/PLATELET
Abs Immature Granulocytes: 0.13 10*3/uL — ABNORMAL HIGH (ref 0.00–0.07)
Basophils Absolute: 0.1 10*3/uL (ref 0.0–0.1)
Basophils Relative: 0 %
Eosinophils Absolute: 0.2 10*3/uL (ref 0.0–0.5)
Eosinophils Relative: 1 %
HCT: 49.9 % (ref 39.0–52.0)
Hemoglobin: 17.7 g/dL — ABNORMAL HIGH (ref 13.0–17.0)
Immature Granulocytes: 1 %
Lymphocytes Relative: 7 %
Lymphs Abs: 1.2 10*3/uL (ref 0.7–4.0)
MCH: 34.4 pg — ABNORMAL HIGH (ref 26.0–34.0)
MCHC: 35.5 g/dL (ref 30.0–36.0)
MCV: 96.9 fL (ref 80.0–100.0)
Monocytes Absolute: 0.7 10*3/uL (ref 0.1–1.0)
Monocytes Relative: 4 %
Neutro Abs: 15.7 10*3/uL — ABNORMAL HIGH (ref 1.7–7.7)
Neutrophils Relative %: 87 %
Platelets: 129 10*3/uL — ABNORMAL LOW (ref 150–400)
RBC: 5.15 MIL/uL (ref 4.22–5.81)
RDW: 13 % (ref 11.5–15.5)
WBC: 17.9 10*3/uL — ABNORMAL HIGH (ref 4.0–10.5)
nRBC: 0 % (ref 0.0–0.2)

## 2018-12-24 LAB — BASIC METABOLIC PANEL
Anion gap: 9 (ref 5–15)
BUN: 12 mg/dL (ref 6–20)
CO2: 22 mmol/L (ref 22–32)
Calcium: 7.7 mg/dL — ABNORMAL LOW (ref 8.9–10.3)
Chloride: 105 mmol/L (ref 98–111)
Creatinine, Ser: 1.32 mg/dL — ABNORMAL HIGH (ref 0.61–1.24)
GFR calc Af Amer: >60 mL/min (ref >60)
GFR calc non Af Amer: >60 mL/min (ref >60)
Glucose, Bld: 101 mg/dL — ABNORMAL HIGH (ref 70–99)
Potassium: 3.4 mmol/L — ABNORMAL LOW (ref 3.5–5.1)
Sodium: 136 mmol/L (ref 135–145)

## 2018-12-24 LAB — URINE DRUG SCREEN, QUALITATIVE (ARMC ONLY)
Amphetamines, Ur Screen: NOT DETECTED
Barbiturates, Ur Screen: NOT DETECTED
Benzodiazepine, Ur Scrn: NOT DETECTED
Cannabinoid 50 Ng, Ur ~~LOC~~: NOT DETECTED
Cocaine Metabolite,Ur ~~LOC~~: NOT DETECTED
MDMA (Ecstasy)Ur Screen: NOT DETECTED
Methadone Scn, Ur: NOT DETECTED
Opiate, Ur Screen: NOT DETECTED
Phencyclidine (PCP) Ur S: NOT DETECTED
Tricyclic, Ur Screen: NOT DETECTED

## 2018-12-24 LAB — SARS CORONAVIRUS 2 BY RT PCR (HOSPITAL ORDER, PERFORMED IN ~~LOC~~ HOSPITAL LAB): SARS Coronavirus 2: NEGATIVE

## 2018-12-24 LAB — LIPASE, BLOOD: Lipase: 17 U/L (ref 11–51)

## 2018-12-24 LAB — MAGNESIUM: Magnesium: 1.8 mg/dL (ref 1.7–2.4)

## 2018-12-24 LAB — LACTIC ACID, PLASMA: Lactic Acid, Venous: 1.8 mmol/L (ref 0.5–1.9)

## 2018-12-24 LAB — C DIFFICILE QUICK SCREEN W PCR REFLEX
C Diff antigen: NEGATIVE
C Diff interpretation: NOT DETECTED
C Diff toxin: NEGATIVE

## 2018-12-24 MED ORDER — POTASSIUM CHLORIDE 20 MEQ PO PACK
40.0000 meq | PACK | Freq: Once | ORAL | Status: AC
  Administered 2018-12-24: 40 meq via ORAL
  Filled 2018-12-24: qty 2

## 2018-12-24 MED ORDER — POTASSIUM CHLORIDE 10 MEQ/100ML IV SOLN
10.0000 meq | INTRAVENOUS | Status: AC
  Administered 2018-12-24 (×3): 10 meq via INTRAVENOUS
  Filled 2018-12-24 (×3): qty 100

## 2018-12-24 MED ORDER — IOHEXOL 300 MG/ML  SOLN
100.0000 mL | Freq: Once | INTRAMUSCULAR | Status: AC | PRN
  Administered 2018-12-24: 100 mL via INTRAVENOUS

## 2018-12-24 MED ORDER — CIPROFLOXACIN IN D5W 400 MG/200ML IV SOLN
400.0000 mg | Freq: Two times a day (BID) | INTRAVENOUS | Status: DC
  Administered 2018-12-24: 400 mg via INTRAVENOUS
  Administered 2018-12-25: 200 mg via INTRAVENOUS
  Filled 2018-12-24 (×4): qty 200

## 2018-12-24 MED ORDER — SODIUM CHLORIDE 0.9 % IV BOLUS
1000.0000 mL | Freq: Once | INTRAVENOUS | Status: AC
  Administered 2018-12-24: 1000 mL via INTRAVENOUS

## 2018-12-24 MED ORDER — SODIUM CHLORIDE 0.9 % IV SOLN
2.0000 g | Freq: Once | INTRAVENOUS | Status: AC
  Administered 2018-12-24: 2 g via INTRAVENOUS
  Filled 2018-12-24: qty 20

## 2018-12-24 MED ORDER — METRONIDAZOLE IN NACL 5-0.79 MG/ML-% IV SOLN
500.0000 mg | Freq: Three times a day (TID) | INTRAVENOUS | Status: DC
  Administered 2018-12-24: 500 mg via INTRAVENOUS
  Filled 2018-12-24 (×4): qty 100

## 2018-12-24 MED ORDER — SODIUM CHLORIDE 0.9 % IV SOLN
INTRAVENOUS | Status: DC
  Administered 2018-12-24 – 2018-12-25 (×2): via INTRAVENOUS

## 2018-12-24 MED ORDER — ACETAMINOPHEN 650 MG RE SUPP: 650.0000 mg | Freq: Four times a day (QID) | RECTAL | Status: DC | PRN

## 2018-12-24 MED ORDER — POTASSIUM CHLORIDE CRYS ER 20 MEQ PO TBCR
40.0000 meq | EXTENDED_RELEASE_TABLET | Freq: Once | ORAL | Status: DC
  Filled 2018-12-24: qty 2

## 2018-12-24 MED ORDER — METRONIDAZOLE IN NACL 5-0.79 MG/ML-% IV SOLN: 500.0000 mg | Freq: Three times a day (TID) | INTRAVENOUS | Status: DC

## 2018-12-24 MED ORDER — MAGNESIUM SULFATE 2 GM/50ML IV SOLN
2.0000 g | Freq: Once | INTRAVENOUS | Status: AC
  Administered 2018-12-24: 2 g via INTRAVENOUS
  Filled 2018-12-24: qty 50

## 2018-12-24 MED ORDER — ENOXAPARIN SODIUM 40 MG/0.4ML ~~LOC~~ SOLN: 40.0000 mg | SUBCUTANEOUS | Status: DC

## 2018-12-24 MED ORDER — LACTATED RINGERS IV SOLN
INTRAVENOUS | Status: DC
  Administered 2018-12-24: 13:00:00 via INTRAVENOUS

## 2018-12-24 MED ORDER — ACETAMINOPHEN 500 MG PO TABS
1000.0000 mg | ORAL_TABLET | Freq: Once | ORAL | Status: AC
  Administered 2018-12-24: 11:00:00 1000 mg via ORAL
  Filled 2018-12-24: qty 2

## 2018-12-24 MED ORDER — ACETAMINOPHEN 325 MG PO TABS
650.0000 mg | ORAL_TABLET | Freq: Four times a day (QID) | ORAL | Status: DC | PRN
  Administered 2018-12-24 – 2018-12-25 (×2): 650 mg via ORAL
  Filled 2018-12-24 (×2): qty 2

## 2018-12-24 MED ORDER — KETOROLAC TROMETHAMINE 30 MG/ML IJ SOLN: 30.0000 mg | Freq: Four times a day (QID) | INTRAMUSCULAR | Status: DC | PRN

## 2018-12-24 MED ORDER — PROMETHAZINE HCL 25 MG/ML IJ SOLN
25.0000 mg | Freq: Once | INTRAMUSCULAR | Status: AC
  Administered 2018-12-24: 25 mg via INTRAVENOUS

## 2018-12-24 NOTE — ED Notes (Signed)
Report to Molly, RN.

## 2018-12-24 NOTE — ED Notes (Signed)
Pt to Ct. EDP informed pt was unable to take PO potassium.

## 2018-12-24 NOTE — Consult Note (Addendum)
[facility] GI Inpatient Consult Note   Kathline Magic, M.D.  Reason for Consult: Abdominal pain, diarrhea, acute Salmonella colitis   Attending Requesting Consult: Rufina Falco, NP   History of Present Illness: Douglas Greer is a 28 y.o. male who reports he is otherwise healthy presenting for just over 24 hours of progressive abdominal cramping, fever, nausea And diarrhea with 12-20 watery stools per day.  He denies any sick contacts.  He believes he may have eaten "bad pizza" 2 days ago from a restaurant called "[employer]".  Patient has admitted that he has been on the public attending some parties in the area.  He tested negative, however, for COVID-19 by rapid testing  Past Medical History:  Past Medical History:  Diagnosis Date  . Low HDL (under 40) 11/26/2015  . Tobacco use 11/27/2015    Problem List: Patient Active Problem List   Diagnosis Date Noted  . Colitis presumed infectious 12/24/2018  . 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 Screen for STD (sexually transmitted disease) 08/11/2018  . Overweight 01/24/2018  . 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 Need for HPV vaccination 06/21/2017  . High hematocrit 06/21/2017  . Elevated hemoglobin ([place]) 06/21/2017  . Tobacco use 11/27/2015  . Total bilirubin, elevated 11/26/2015  . Low HDL (under 40) 11/26/2015  . Screening for HIV (human immunodeficiency virus) 11/25/2015  . Preventative health care 11/25/2015  . Verruca plana 11/25/2015    Past Surgical History: Past Surgical History:  Procedure Laterality Date  . INCISIONAL HERNIA REPAIR    . NASAL FRACTURE SURGERY      Allergies: Allergies  Allergen Reactions  . Amoxil [Amoxicillin] Rash    Home Medications: Medications Prior to Admission  Medication Sig Dispense Refill Last Dose  . Melatonin 5 MG TABS Take 5 mg by mouth at bedtime as needed (sleep).   Unknown at PRN   Home medication reconciliation was completed with the  patient.   Scheduled Inpatient Medications:   . enoxaparin (LOVENOX) injection  40 mg Subcutaneous Q24H  . potassium chloride  40 mEq Oral Once    Continuous Inpatient Infusions:   . sodium chloride 125 mL/hr at 12/24/18 1543  . ciprofloxacin 400 mg (12/24/18 1416)  . lactated ringers 125 mL/hr at 12/24/18 1329  . metronidazole Stopped (12/24/18 1241)    PRN Inpatient Medications:  ketorolac  Family History: family history includes Allergies in his son; Asthma in his son; Diabetes in his father; Hypertension in his mother.   GI Family History: Negative  Social History:   reports that he has been smoking cigarettes. He has been smoking about 1.00 pack per day. He has never used smokeless tobacco. He reports current alcohol use. He reports current drug use. Drug: Marijuana. The patient denies ETOH, tobacco, or drug use.    Review of Systems: Review of Systems - General ROS: positive for  - chills, fever and malaise negative for - weight gain or weight loss Psychological ROS: negative ENT ROS: negative for - epistaxis, headaches or sore throat Hematological and Lymphatic ROS: negative for - bleeding problems, blood clots or jaundice Endocrine ROS: negative Respiratory ROS: positive for - cough negative for - pleuritic pain, shortness of breath or wheezing Cardiovascular ROS: no chest pain or dyspnea on exertion Musculoskeletal ROS: negative Neurological ROS: no TIA or stroke symptoms Dermatological ROS: negative  Physical Examination: BP 111/64 (BP Location: Right Arm)   Pulse 95   Temp 99.9 F (37.7 C) (Oral)   Resp 17  Ht 5\' 8"  (1.727 m)   Wt 83.9 kg   SpO2 100%   BMI 28.13 kg/m  Physical Exam Constitutional:      General: He is not in acute distress.    Appearance: He is normal weight. He is ill-appearing and diaphoretic.  HENT:     Head: Normocephalic and atraumatic.     Nose: Nose normal.     Mouth/Throat:     Mouth: Mucous membranes are dry.      Pharynx: No oropharyngeal exudate or posterior oropharyngeal erythema.  Eyes:     Conjunctiva/sclera: Conjunctivae normal.     Pupils: Pupils are equal, round, and reactive to light.  Neck:     Musculoskeletal: Normal range of motion and neck supple. Muscular tenderness present.  Cardiovascular:     Rate and Rhythm: Normal rate.     Pulses: Normal pulses.     Heart sounds: Normal heart sounds.  Pulmonary:     Effort: Pulmonary effort is normal.  Abdominal:     General: Bowel sounds are normal.     Palpations: Abdomen is soft.  Musculoskeletal: Normal range of motion.  Skin:    General: Skin is warm.     Coloration: Skin is not jaundiced.  Neurological:     General: No focal deficit present.     Mental Status: He is alert.  Psychiatric:        Mood and Affect: Mood normal.        Behavior: Behavior normal.     Data: Lab Results  Component Value Date   WBC 17.9 (H) 12/24/2018   HGB 17.7 (H) 12/24/2018   HCT 49.9 12/24/2018   MCV 96.9 12/24/2018   PLT 129 (L) 12/24/2018   Recent Labs  Lab 12/24/18 0826  HGB 17.7*   Lab Results  Component Value Date   NA 135 12/24/2018   K 2.7 (LL) 12/24/2018   CL 98 12/24/2018   CO2 24 12/24/2018   BUN 14 12/24/2018   CREATININE 1.59 (H) 12/24/2018   Lab Results  Component Value Date   ALT 29 12/24/2018   AST 29 12/24/2018   ALKPHOS 46 12/24/2018   BILITOT 2.3 (H) 12/24/2018   No results for input(s): APTT, INR, PTT in the last 168 hours. CBC Latest Ref Rng & Units 12/24/2018 08/11/2018 01/24/2018  WBC 4.0 - 10.5 K/uL 17.9(H) 10.6 11.9(H)  Hemoglobin 13.0 - 17.0 g/dL 17.7(H) 17.6(H) 17.4(H)  Hematocrit 39.0 - 52.0 % 49.9 49.0 49.9  Platelets 150 - 400 K/uL 129(L) 190 172    STUDIES: Ct Abdomen Pelvis W Contrast  Result Date: 12/24/2018 CLINICAL DATA:  Nausea, vomiting and diarrhea for 24 hours. Elevated white blood cell count. EXAM: CT ABDOMEN AND PELVIS WITH CONTRAST TECHNIQUE: Multidetector CT imaging of the abdomen and  pelvis was performed using the standard protocol following bolus administration of intravenous contrast. CONTRAST:  100mL OMNIPAQUE IOHEXOL 300 MG/ML  SOLN COMPARISON:  X-ray of chest and abdomen December 24, 2018 FINDINGS: Lower chest: No acute abnormality. Hepatobiliary: No focal liver abnormality is seen. No gallstones, gallbladder wall thickening, or biliary dilatation. Pancreas: Unremarkable. No pancreatic ductal dilatation or surrounding inflammatory changes. Spleen: Normal in size without focal abnormality. Adrenals/Urinary Tract: Adrenal glands are unremarkable. Kidneys are normal, without renal calculi, focal lesion, or hydronephrosis. Bladder is unremarkable. Stomach/Bowel: There are thickened bowel wall small bowel loops in the distal ileum. There is thick walled right and transverse colon with surrounding stranding inflammation. There is no small bowel obstruction. The appendix  is normal. Vascular/Lymphatic: No significant vascular findings are present. No enlarged abdominal or pelvic lymph nodes. Reproductive: Prostate is unremarkable. Other: No abdominal wall hernia or abnormality. No abdominopelvic ascites. Musculoskeletal: No acute or significant osseous findings. IMPRESSION: Thick wall transverse and right colon with surrounding inflammation. Thick walled distal ileum. The findings are nonspecific, differential diagnosis and occludes infectious/inflammatory etiology, including inflammatory bowel disease. There is no evidence of bowel obstruction. Electronically Signed   By: Sherian ReinWei-Chen  Lin M.D.   On: 12/24/2018 10:55   Dg Abdomen Acute W/chest  Result Date: 12/24/2018 CLINICAL DATA:  Nausea, vomiting and diarrhea for 1 day. EXAM: DG ABDOMEN ACUTE W/ 1V CHEST COMPARISON:  None. FINDINGS: There are few air-filled mild dilated small bowel loops in the abdomen with question bowel wall thickening. There is relative paucity of bowel gas. No radiopaque calculi or other significant radiographic abnormality is  seen. Heart size and mediastinal contours are within normal limits. Both lungs are clear. IMPRESSION: A few air-filled mildly dilated small bowel loops in the abdomen with question bowel wall thickening. Consider further evaluation with CT abdomen pelvis with contrast. No acute cardiopulmonary disease. Electronically Signed   By: Sherian ReinWei-Chen  Lin M.D.   On: 12/24/2018 09:26   @IMAGES @  Assessment:  1. Acute colitis - Considered infectious, I.e. Salmonellosis. Also consider this as a superimposed infection on underlying IBD which is less likely. Young patient with an acute severe presentation. Also consider possible immunodeficiency in setting of toxic presentation.  2. Dehydration with hypokalemia  3. Fever   COVID-19 status:    Tested negative      Recommendations:  1. Blood cultures as ordered. 2. Continue cipro, consider discontinuing flagyl. 3. Continue brisk IV resuscitation. 4. Clear liquid diet. 5. Will likely need colonoscopy as an outpatient for f/u to rule out IBD. 6. Check HIV test. 7. Further recommendations will be made based upon patient studies and response to medical therapy.  Thank you for the consult. Please call with questions or concerns.  Rosina Lowensteinoledo, , "Mellody DanceKeith" MD [facility] [address] [place], [place] [address] [phone]  12/24/2018 4:50 PM

## 2018-12-24 NOTE — ED Notes (Addendum)
ED TO INPATIENT HANDOFF REPORT  ED Nurse Name and Phone #: ally [phone]  S Name/Age/Gender Douglas Greer 28 y.o. male Room/Bed: ED03A/ED03A  Code Status   Code Status: Not on file  Home/SNF/Other Home Patient oriented to: self, place, time and situation Is this baseline? Yes   Triage Complete: Triage complete  Chief Complaint diarrhea fever  Triage Note Pt c/o HA with N/V/D that started yesterday.   Allergies Allergies  Allergen Reactions  . Amoxil [Amoxicillin] Rash    Level of Care/Admitting Diagnosis ED Disposition    ED Disposition Condition Comment   Admit  The patient appears reasonably stabilized for admission considering the current resources, flow, and capabilities available in the ED at this time, and I doubt any other [facility] requiring further screening and/or treatment in the ED prior to admission is  present.       B Medical/Surgery History Past Medical History:  Diagnosis Date  . Low HDL (under 40) 11/26/2015  . Tobacco use 11/27/2015   Past Surgical History:  Procedure Laterality Date  . INCISIONAL HERNIA REPAIR    . NASAL FRACTURE SURGERY       A IV Location/Drains/Wounds Patient Lines/Drains/Airways Status   Active Line/Drains/Airways    Name:   Placement date:   Placement time:   Site:   Days:   Peripheral IV 12/24/18 Right Antecubital   12/24/18    0830    Antecubital   less than 1   Peripheral IV 12/24/18 Left Antecubital   12/24/18    0950    Antecubital   less than 1          Intake/Output Last 24 hours No intake or output data in the 24 hours ending 12/24/18 1134  Labs/Imaging Results for orders placed or performed during the hospital encounter of 12/24/18 (from the past 48 hour(s))  CBC with Differential     Status: Abnormal   Collection Time: 12/24/18  8:26 AM  Result Value Ref Range   WBC 17.9 (H) 4.0 - 10.5 K/uL   RBC 5.15 4.22 - 5.81 MIL/uL   Hemoglobin 17.7 (H) 13.0 - 17.0 g/dL   HCT 49.9 39.0 - 52.0 %   MCV 96.9  80.0 - 100.0 fL   MCH 34.4 (H) 26.0 - 34.0 pg   MCHC 35.5 30.0 - 36.0 g/dL   RDW 13.0 11.5 - 15.5 %   Platelets 129 (L) 150 - 400 K/uL   nRBC 0.0 0.0 - 0.2 %   Neutrophils Relative % 87 %   Neutro Abs 15.7 (H) 1.7 - 7.7 K/uL   Lymphocytes Relative 7 %   Lymphs Abs 1.2 0.7 - 4.0 K/uL   Monocytes Relative 4 %   Monocytes Absolute 0.7 0.1 - 1.0 K/uL   Eosinophils Relative 1 %   Eosinophils Absolute 0.2 0.0 - 0.5 K/uL   Basophils Relative 0 %   Basophils Absolute 0.1 0.0 - 0.1 K/uL   Immature Granulocytes 1 %   Abs Immature Granulocytes 0.13 (H) 0.00 - 0.07 K/uL    Comment: Performed at [facility], [address] [place], [place] [address]  Comprehensive metabolic panel     Status: Abnormal   Collection Time: 12/24/18  8:26 AM  Result Value Ref Range   Sodium 135 135 - 145 mmol/L   Potassium 2.7 (LL) 3.5 - 5.1 mmol/L    Comment: CRITICAL RESULT CALLED TO, READ BACK BY AND VERIFIED WITH  ALLY Verble Styron AT 0814 12/24/2018 SDR    Chloride 98  98 - 111 mmol/L   CO2 24 22 - 32 mmol/L   Glucose, Bld 107 (H) 70 - 99 mg/dL   BUN 14 6 - 20 mg/dL   Creatinine, Ser 4.091.59 (H) 0.61 - 1.24 mg/dL   Calcium 8.7 (L) 8.9 - 10.3 mg/dL   Total Protein 7.9 6.5 - 8.1 g/dL   Albumin 4.6 3.5 - 5.0 g/dL   AST 29 15 - 41 U/L   ALT 29 0 - 44 U/L   Alkaline Phosphatase 46 38 - 126 U/L   Total Bilirubin 2.3 (H) 0.3 - 1.2 mg/dL   GFR calc non Af Amer 58 (L) >60 mL/min   GFR calc Af Amer >60 >60 mL/min   Anion gap 13 5 - 15    Comment: Performed at [facility], [address] [place], [place] [address]  Lipase, blood     Status: None   Collection Time: 12/24/18  8:26 AM  Result Value Ref Range   Lipase 17 11 - 51 U/L    Comment: Performed at [facility], [address] [place], [place] [address]  SARS Coronavirus 2 (CEPHEID - Performed in [facility] lab), Hosp Order     Status: None   Collection Time: 12/24/18  8:26 AM   Specimen: Nasopharyngeal Swab  Result  Value Ref Range   SARS Coronavirus 2 NEGATIVE NEGATIVE    Comment: (NOTE) If result is NEGATIVE SARS-CoV-2 target nucleic acids are NOT DETECTED. The SARS-CoV-2 RNA is generally detectable in upper and lower  respiratory specimens during the acute phase of infection. The lowest  concentration of SARS-CoV-2 viral copies this assay can detect is 250  copies / mL. A negative result does not preclude SARS-CoV-2 infection  and should not be used as the sole basis for treatment or other  patient management decisions.  A negative result may occur with  improper specimen collection / handling, submission of specimen other  than nasopharyngeal swab, presence of viral mutation(s) within the  areas targeted by this assay, and inadequate number of viral copies  (<250 copies / mL). A negative result must be combined with clinical  observations, patient history, and epidemiological information. If result is POSITIVE SARS-CoV-2 target nucleic acids are DETECTED. The SARS-CoV-2 RNA is generally detectable in upper and lower  respiratory specimens dur ing the acute phase of infection.  Positive  results are indicative of active infection with SARS-CoV-2.  Clinical  correlation with patient history and other diagnostic information is  necessary to determine patient infection status.  Positive results do  not rule out bacterial infection or co-infection with other viruses. If result is PRESUMPTIVE POSTIVE SARS-CoV-2 nucleic acids MAY BE PRESENT.   A presumptive positive result was obtained on the submitted specimen  and confirmed on repeat testing.  While 2019 novel coronavirus  (SARS-CoV-2) nucleic acids may be present in the submitted sample  additional confirmatory testing may be necessary for epidemiological  and / or clinical management purposes  to differentiate between  SARS-CoV-2 and other Sarbecovirus currently known to infect humans.  If clinically indicated additional testing with an  alternate test  methodology [phone]([id number]) is advised. The SARS-CoV-2 RNA is generally  detectable in upper and lower respiratory sp ecimens during the acute  phase of infection. The expected result is Negative. Fact Sheet for Patients:  BoilerBrush.com.cyhttps://www.fda.gov/media/136312/download Fact Sheet for Healthcare Providers: https://pope.com/https://www.fda.gov/media/136313/download This test is not yet approved or cleared by the [place] FDA and has been authorized for detection and/or diagnosis  of SARS-CoV-2 by FDA under an Emergency Use Authorization (EUA).  This EUA will remain in effect (meaning this test can be used) for the duration of the COVID-19 declaration under Section 564(b)(1) of the Act, 21 U.S.C. section 360bbb-3(b)(1), unless the authorization is terminated or revoked sooner. Performed at [facility], [address] [place], [place] [address]   Urinalysis, Complete w Microscopic     Status: Abnormal   Collection Time: 12/24/18  8:26 AM  Result Value Ref Range   Color, Urine AMBER (A) YELLOW    Comment: BIOCHEMICALS MAY BE AFFECTED BY COLOR   APPearance CLEAR (A) CLEAR   Specific Gravity, Urine 1.027 1.005 - 1.030   pH 6.0 5.0 - 8.0   Glucose, UA NEGATIVE NEGATIVE mg/dL   Hgb urine dipstick LARGE (A) NEGATIVE   Bilirubin Urine NEGATIVE NEGATIVE   Ketones, ur 20 (A) NEGATIVE mg/dL   Protein, ur 295100 (A) NEGATIVE mg/dL   Nitrite NEGATIVE NEGATIVE   Leukocytes,Ua NEGATIVE NEGATIVE   RBC / HPF 21-50 0 - 5 RBC/hpf   WBC, UA 0-5 0 - 5 WBC/hpf   Bacteria, UA NONE SEEN NONE SEEN   Squamous Epithelial / LPF 0-5 0 - 5   Mucus PRESENT     Comment: Performed at [facility], [address] [place], [place] [address]  Lactic acid, plasma     Status: None   Collection Time: 12/24/18  9:50 AM  Result Value Ref Range   Lactic Acid, Venous 1.8 0.5 - 1.9 mmol/L    Comment: Performed at [facility], [address] [place], [place] [address]   Ct Abdomen Pelvis W  Contrast  Result Date: 12/24/2018 CLINICAL DATA:  Nausea, vomiting and diarrhea for 24 hours. Elevated white blood cell count. EXAM: CT ABDOMEN AND PELVIS WITH CONTRAST TECHNIQUE: Multidetector CT imaging of the abdomen and pelvis was performed using the standard protocol following bolus administration of intravenous contrast. CONTRAST:  100mL OMNIPAQUE IOHEXOL 300 MG/ML  SOLN COMPARISON:  X-ray of chest and abdomen December 24, 2018 FINDINGS: Lower chest: No acute abnormality. Hepatobiliary: No focal liver abnormality is seen. No gallstones, gallbladder wall thickening, or biliary dilatation. Pancreas: Unremarkable. No pancreatic ductal dilatation or surrounding inflammatory changes. Spleen: Normal in size without focal abnormality. Adrenals/Urinary Tract: Adrenal glands are unremarkable. Kidneys are normal, without renal calculi, focal lesion, or hydronephrosis. Bladder is unremarkable. Stomach/Bowel: There are thickened bowel wall small bowel loops in the distal ileum. There is thick walled right and transverse colon with surrounding stranding inflammation. There is no small bowel obstruction. The appendix is normal. Vascular/Lymphatic: No significant vascular findings are present. No enlarged abdominal or pelvic lymph nodes. Reproductive: Prostate is unremarkable. Other: No abdominal wall hernia or abnormality. No abdominopelvic ascites. Musculoskeletal: No acute or significant osseous findings. IMPRESSION: Thick wall transverse and right colon with surrounding inflammation. Thick walled distal ileum. The findings are nonspecific, differential diagnosis and occludes infectious/inflammatory etiology, including inflammatory bowel disease. There is no evidence of bowel obstruction. Electronically Signed   By: Sherian ReinWei-Chen  Lin M.D.   On: 12/24/2018 10:55   Dg Abdomen Acute W/chest  Result Date: 12/24/2018 CLINICAL DATA:  Nausea, vomiting and diarrhea for 1 day. EXAM: DG ABDOMEN ACUTE W/ 1V CHEST COMPARISON:  None.  FINDINGS: There are few air-filled mild dilated small bowel loops in the abdomen with question bowel wall thickening. There is relative paucity of bowel gas. No radiopaque calculi or other significant radiographic abnormality is seen. Heart size and mediastinal contours are  within normal limits. Both lungs are clear. IMPRESSION: A few air-filled mildly dilated small bowel loops in the abdomen with question bowel wall thickening. Consider further evaluation with CT abdomen pelvis with contrast. No acute cardiopulmonary disease. Electronically Signed   By: Sherian ReinWei-Chen  Lin M.D.   On: 12/24/2018 09:26    Pending Labs Unresulted Labs (From admission, onward)    Start     Ordered   12/24/18 1105  C difficile quick scan w PCR reflex  (C Difficile quick screen w PCR reflex panel)  Once, for 24 hours,   STAT     12/24/18 1104   12/24/18 1104  Gastrointestinal Panel by PCR , Stool  (Gastrointestinal Panel by PCR, Stool)  Once,   STAT     12/24/18 1104   12/24/18 0931  Blood culture (routine x 2)  BLOOD CULTURE X 2,   STAT     12/24/18 0930          Vitals/Pain Today's Vitals   12/24/18 1006 12/24/18 1036 12/24/18 1100 12/24/18 1127  BP:   123/77   Pulse:   (!) 113   Resp:      Temp:  (!) 102.2 F (39 C)  100.2 F (37.9 C)  TempSrc:  Oral  Oral  SpO2:   99%   Weight:      Height:      PainSc: 3        Isolation Precautions Enteric precautions (UV disinfection)  Medications Medications  potassium chloride SA (K-DUR) CR tablet 40 mEq (40 mEq Oral Not Given 12/24/18 0936)  potassium chloride 10 mEq in 100 mL IVPB (10 mEq Intravenous New Bag/Given 12/24/18 1133)  cefTRIAXone (ROCEPHIN) 2 g in sodium chloride 0.9 % 100 mL IVPB (2 g Intravenous New Bag/Given 12/24/18 1131)  metroNIDAZOLE (FLAGYL) IVPB 500 mg (500 mg Intravenous New Bag/Given 12/24/18 1128)  sodium chloride 0.9 % bolus 1,000 mL (0 mLs Intravenous Stopped 12/24/18 1006)  promethazine (PHENERGAN) injection 25 mg (25 mg Intravenous  Given 12/24/18 0830)  sodium chloride 0.9 % bolus 1,000 mL (1,000 mLs Intravenous New Bag/Given 12/24/18 0950)  magnesium sulfate IVPB 2 g 50 mL (0 g Intravenous Stopped 12/24/18 1122)  acetaminophen (TYLENOL) tablet 1,000 mg (1,000 mg Oral Given 12/24/18 1036)  potassium chloride (KLOR-CON) packet 40 mEq (40 mEq Oral Given 12/24/18 1036)  iohexol (OMNIPAQUE) 300 MG/ML solution 100 mL (100 mLs Intravenous Contrast Given 12/24/18 1021)  sodium chloride 0.9 % bolus 1,000 mL (1,000 mLs Intravenous New Bag/Given 12/24/18 1131)    Mobility walks Low fall risk   Focused Assessments   R Recommendations: See Admitting Provider Note  Report given to:   Additional Notes: diarrhea, nv X2 days, potassium 2.7. CT shows colitis, received abx, IV potassium and magnesium. Sinus tach approx 105-110 with walking. Febrile at 102 F before antipyretics given. 3L NS. Bilateral IV's 20G  R AC 22G LAC  COVID negative, enteric precautions-pt aware of need for stool sample and agreeable to give once he can. Alert and oriented X

## 2018-12-24 NOTE — ED Provider Notes (Signed)
[facility] Emergency Department Provider Note  ____________________________________________   First MD Initiated Contact with Patient 12/24/18 [phone]     (approximate)  I have reviewed the triage vital signs and the nursing notes.   HISTORY  Chief Complaint Diarrhea and Emesis    HPI Douglas Greer is a 28 y.o. male here with nausea, diarrhea, abdominal cramping, vomiting, headache, and mild cough.  Patient states that starting yesterday, he developed gradual onset of progressively worsening nausea, diarrhea, and generalized body aches.  He states he had a fever up to 102 with chills.  He states that his diarrhea was nonbloody but watery.  He states that he then developed worsening nausea and has been unable to eat or drink since yesterday morning, due to nonbilious, nonbloody emesis.  He also had a mild, nonproductive cough with occasional shortness of breath and coughing, though no shortness of breath at rest or with exertion.  Is also developed a moderate, generalized, aching, throbbing, headache.  Denies known sick contacts of coronavirus in the home, but admits to traveling outside in the public frequently.  No rash.  No other medical problems.  He has not tried anything for his symptoms.       Past Medical History:  Diagnosis Date   Low HDL (under 40) 11/26/2015   Tobacco use 11/27/2015    Patient Active Problem List   Diagnosis Date Noted   Elevated serum glutamic pyruvic transaminase (SGPT) level 08/12/2018   Screen for STD (sexually transmitted disease) 08/11/2018   Overweight 01/24/2018   Neck arthritis 09/27/2017   Need for HPV vaccination 06/21/2017   High hematocrit 06/21/2017   Elevated hemoglobin (HCC) 06/21/2017   Tobacco use 11/27/2015   Total bilirubin, elevated 11/26/2015   Low HDL (under 40) 11/26/2015   Screening for HIV (human immunodeficiency virus) 11/25/2015   Preventative health care 11/25/2015   Verruca  plana 11/25/2015    Past Surgical History:  Procedure Laterality Date   INCISIONAL HERNIA REPAIR     NASAL FRACTURE SURGERY      Prior to Admission medications   Medication Sig Start Date End Date Taking? Authorizing Provider  cyclobenzaprine (FLEXERIL) 10 MG tablet Take 1 tablet (10 mg total) by mouth at bedtime. If needed for neck discomfort 09/27/17   Lada, Janit BernMelinda P, MD  meloxicam (MOBIC) 7.5 MG tablet Take 1 tablet (7.5 mg total) by mouth daily. For neck pain; with food; no other NSAIDs 09/27/17   Lada, Janit BernMelinda P, MD    Allergies Amoxil [amoxicillin]  Family History  Problem Relation Age of Onset   Hypertension Mother    Diabetes Father    Asthma Son    Allergies Son        peanuts    Social History Social History   Tobacco Use   Smoking status: Current Every Day Smoker    Packs/day: 1.00    Types: Cigarettes   Smokeless tobacco: Never Used  Substance Use Topics   Alcohol use: Yes    Alcohol/week: 0.0 standard drinks    Comment: occassional   Drug use: Yes    Types: Marijuana    Review of Systems  Review of Systems  Constitutional: Positive for chills, fatigue and fever.  HENT: Negative for sore throat.   Respiratory: Negative for shortness of breath.   Cardiovascular: Negative for chest pain.  Gastrointestinal: Positive for diarrhea, nausea and vomiting. Negative for abdominal pain.  Genitourinary: Negative for flank pain.  Musculoskeletal: Negative for neck pain.  Skin:  Negative for rash and wound.  Allergic/Immunologic: Negative for immunocompromised state.  Neurological: Positive for headaches. Negative for weakness and numbness.  Hematological: Does not bruise/bleed easily.     ____________________________________________  PHYSICAL EXAM:      VITAL SIGNS: ED Triage Vitals [12/24/18 0802]  Enc Vitals Group     BP      Pulse      Resp      Temp      Temp src      SpO2      Weight 185 lb (83.9 kg)     Height 5\' 8"  (1.727 m)      Head Circumference      Peak Flow      Pain Score 4     Pain Loc      Pain Edu?      Excl. in GC?      Physical Exam Vitals signs and nursing note reviewed.  Constitutional:      General: He is not in acute distress.    Appearance: He is well-developed.  HENT:     Head: Normocephalic and atraumatic.     Mouth/Throat:     Mouth: Mucous membranes are dry.  Eyes:     Conjunctiva/sclera: Conjunctivae normal.  Neck:     Musculoskeletal: Neck supple.  Cardiovascular:     Rate and Rhythm: Normal rate and regular rhythm.     Heart sounds: Normal heart sounds. No murmur. No friction rub.  Pulmonary:     Effort: Pulmonary effort is normal. No respiratory distress.     Breath sounds: Normal breath sounds. No wheezing or rales.  Abdominal:     General: Bowel sounds are increased. There is no distension.     Palpations: Abdomen is soft.     Tenderness: There is generalized abdominal tenderness.     Comments: Minimal tenderness, worse with flexion of the abdomen, no specific right lower quadrant tenderness  Skin:    General: Skin is warm.     Capillary Refill: Capillary refill takes less than 2 seconds.  Neurological:     Mental Status: He is alert and oriented to person, place, and time.     Motor: No abnormal muscle tone.       ____________________________________________   LABS (all labs ordered are listed, but only abnormal results are displayed)  Labs Reviewed  CBC WITH DIFFERENTIAL/PLATELET - Abnormal; Notable for the following components:      Result Value   WBC 17.9 (*)    Hemoglobin 17.7 (*)    MCH 34.4 (*)    Platelets 129 (*)    Neutro Abs 15.7 (*)    Abs Immature Granulocytes 0.13 (*)    All other components within normal limits  COMPREHENSIVE METABOLIC PANEL - Abnormal; Notable for the following components:   Potassium 2.7 (*)    Glucose, Bld 107 (*)    Creatinine, Ser 1.59 (*)    Calcium 8.7 (*)    Total Bilirubin 2.3 (*)    GFR calc non Af Amer 58 (*)     All other components within normal limits  URINALYSIS, COMPLETE (UACMP) WITH MICROSCOPIC - Abnormal; Notable for the following components:   Color, Urine AMBER (*)    APPearance CLEAR (*)    Hgb urine dipstick LARGE (*)    Ketones, ur 20 (*)    Protein, ur 100 (*)    All other components within normal limits  SARS CORONAVIRUS 2 ([facility]  [facility])  CULTURE, BLOOD (ROUTINE X 2)  CULTURE, BLOOD (ROUTINE X 2)  GASTROINTESTINAL PANEL BY PCR, STOOL (REPLACES STOOL CULTURE)  C DIFFICILE QUICK SCREEN W PCR REFLEX  LIPASE, BLOOD  LACTIC ACID, PLASMA    ____________________________________________  EKG: None ________________________________________  RADIOLOGY All imaging, including plain films, CT scans, and ultrasounds, independently reviewed by me, and interpretations confirmed via formal radiology reads.  ED MD interpretation:   None  Official radiology report(s): Ct Abdomen Pelvis W Contrast  Result Date: 12/24/2018 CLINICAL DATA:  Nausea, vomiting and diarrhea for 24 hours. Elevated white blood cell count. EXAM: CT ABDOMEN AND PELVIS WITH CONTRAST TECHNIQUE: Multidetector CT imaging of the abdomen and pelvis was performed using the standard protocol following bolus administration of intravenous contrast. CONTRAST:  100mL OMNIPAQUE IOHEXOL 300 MG/ML  SOLN COMPARISON:  X-ray of chest and abdomen December 24, 2018 FINDINGS: Lower chest: No acute abnormality. Hepatobiliary: No focal liver abnormality is seen. No gallstones, gallbladder wall thickening, or biliary dilatation. Pancreas: Unremarkable. No pancreatic ductal dilatation or surrounding inflammatory changes. Spleen: Normal in size without focal abnormality. Adrenals/Urinary Tract: Adrenal glands are unremarkable. Kidneys are normal, without renal calculi, focal lesion, or hydronephrosis. Bladder is unremarkable. Stomach/Bowel: There are thickened bowel wall small bowel loops in the distal ileum.  There is thick walled right and transverse colon with surrounding stranding inflammation. There is no small bowel obstruction. The appendix is normal. Vascular/Lymphatic: No significant vascular findings are present. No enlarged abdominal or pelvic lymph nodes. Reproductive: Prostate is unremarkable. Other: No abdominal wall hernia or abnormality. No abdominopelvic ascites. Musculoskeletal: No acute or significant osseous findings. IMPRESSION: Thick wall transverse and right colon with surrounding inflammation. Thick walled distal ileum. The findings are nonspecific, differential diagnosis and occludes infectious/inflammatory etiology, including inflammatory bowel disease. There is no evidence of bowel obstruction. Electronically Signed   By: Sherian ReinWei-Chen  Lin M.D.   On: 12/24/2018 10:55   Dg Abdomen Acute W/chest  Result Date: 12/24/2018 CLINICAL DATA:  Nausea, vomiting and diarrhea for 1 day. EXAM: DG ABDOMEN ACUTE W/ 1V CHEST COMPARISON:  None. FINDINGS: There are few air-filled mild dilated small bowel loops in the abdomen with question bowel wall thickening. There is relative paucity of bowel gas. No radiopaque calculi or other significant radiographic abnormality is seen. Heart size and mediastinal contours are within normal limits. Both lungs are clear. IMPRESSION: A few air-filled mildly dilated small bowel loops in the abdomen with question bowel wall thickening. Consider further evaluation with CT abdomen pelvis with contrast. No acute cardiopulmonary disease. Electronically Signed   By: Sherian ReinWei-Chen  Lin M.D.   On: 12/24/2018 09:26    ____________________________________________  PROCEDURES   Procedure(s) performed (including Critical Care):  .Critical Care Performed by: Shaune PollackIsaacs, Lenell Lama, MD Authorized by: Shaune PollackIsaacs, Fleetwood Pierron, MD   Critical care provider statement:    Critical care time (minutes):  45   Critical care time was exclusive of:  Separately billable procedures and treating other patients  and teaching time   Critical care was necessary to treat or prevent imminent or life-threatening deterioration of the following conditions:  Cardiac failure, circulatory failure and sepsis   Critical care was time spent personally by me on the following activities:  Development of treatment plan with patient or surrogate, discussions with consultants, evaluation of patient's response to treatment, examination of patient, obtaining history from patient or surrogate, ordering and performing treatments and interventions, ordering and review of laboratory studies, ordering and review of radiographic studies, pulse oximetry, re-evaluation of patient's condition and  review of old charts   I assumed direction of critical care for this patient from another provider in my specialty: no      ____________________________________________  INITIAL IMPRESSION / MDM / ASSESSMENT AND PLAN / ED COURSE  As part of my medical decision making, I reviewed the following data within the electronic MEDICAL RECORD NUMBER Notes from prior ED visits and [place] Controlled Substance Database      *Douglas Greer was evaluated in Emergency Department on 12/24/2018 for the symptoms described in the history of present illness. He was evaluated in the context of the global COVID-19 pandemic, which necessitated consideration that the patient might be at risk for infection with the SARS-CoV-2 virus that causes COVID-19. Institutional protocols and algorithms that pertain to the evaluation of patients at risk for COVID-19 are in a state of rapid change based on information released by regulatory bodies including the CDC and federal and state organizations. These policies and algorithms were followed during the patient's care in the ED.  Some ED evaluations and interventions may be delayed as a result of limited staffing during the pandemic.*   Clinical Course as of Dec 23 1104  Wed Dec 24, 2018  0921 28 yo M here with fever, chills,  n/v/d. No specific abd TTP. DDx includes COVID, viral illness, food-borne illness (less likely). No focal TTP to suggest cholecystitis, appendicitis, diverticulitis. No h/o intra-abd surgeries. Will treat with fluids, antiemetics, check COVID and screening labs.   [CI]  1012 Labs, imaging as above.  CBC with significant leukocytosis with left shift consistent with acute inflammation or infection.  Urinalysis shows hematuria and ketonuria consistent with dehydration, less likely stone.  CMP shows hypokalemia and mild AKI likely secondary to dehydration.  Has been given IV fluids.  Given his fever, tachycardia, ketonuria, will send for CT to assess for colitis or other bacterial etiology.  COVID negative.   [CI]    Clinical Course User Index [CI] Shaune Pollack, MD    Medical Decision Making: 28 year old male here with nausea, vomiting, diarrhea.  See above for description.  Labs show significant leukocytosis, mild AKI with hyperkalemia, and elevated bilirubin.  CT scan is consistent with diffuse colitis.  Will send GI panel.  No recent biotic use or travel.  Will start empiric antibiotics.  Patient with persistent nausea, vomiting, unable to tolerate p.o. after antiemetics in the ED.  Admit for further treatment.  ____________________________________________  FINAL CLINICAL IMPRESSION(S) / ED DIAGNOSES  Final diagnoses:  Sepsis without acute organ dysfunction, due to unspecified organism (HCC)  Dehydration  Hypokalemia     MEDICATIONS GIVEN DURING THIS VISIT:  Medications  potassium chloride SA (K-DUR) CR tablet 40 mEq (40 mEq Oral Not Given 12/24/18 0936)  potassium chloride 10 mEq in 100 mL IVPB (10 mEq Intravenous New Bag/Given 12/24/18 1002)  cefTRIAXone (ROCEPHIN) 2 g in sodium chloride 0.9 % 100 mL IVPB (has no administration in time range)  sodium chloride 0.9 % bolus 1,000 mL (has no administration in time range)  metroNIDAZOLE (FLAGYL) IVPB 500 mg (has no administration in time  range)  sodium chloride 0.9 % bolus 1,000 mL (0 mLs Intravenous Stopped 12/24/18 1006)  promethazine (PHENERGAN) injection 25 mg (25 mg Intravenous Given 12/24/18 0830)  sodium chloride 0.9 % bolus 1,000 mL (1,000 mLs Intravenous New Bag/Given 12/24/18 0950)  magnesium sulfate IVPB 2 g 50 mL (2 g Intravenous New Bag/Given 12/24/18 0938)  acetaminophen (TYLENOL) tablet 1,000 mg (1,000 mg Oral Given 12/24/18 1036)  potassium chloride (  KLOR-CON) packet 40 mEq (40 mEq Oral Given 12/24/18 1036)  iohexol (OMNIPAQUE) 300 MG/ML solution 100 mL (100 mLs Intravenous Contrast Given 12/24/18 1021)     ED Discharge Orders    None       Note:  This document was prepared using Dragon voice recognition software and may include unintentional dictation errors.   Duffy Bruce, MD 12/24/18 1106

## 2018-12-24 NOTE — ED Notes (Signed)
Date and time results received: 12/24/18 9:31 AM   Test: potassium Critical Value: 2.7  Name of Provider Notified: Ellender Hose

## 2018-12-24 NOTE — Consult Note (Signed)
Pharmacy Antibiotic Note  Douglas Greer is a 28 y.o. male admitted on 12/24/2018 with Intra-abdominal Infection.  Pharmacy has been consulted for Ciprofloxacin dosing. Patient had CTX and metronidazole in the ED x1 dose.   Allergy: Amoxcillin   Had CTX in the ED   Plan: Ciprofloxacin IV 400 mg Q12H   Height: 5\' 8"  (172.7 cm) Weight: 185 lb (83.9 kg) IBW/kg (Calculated) : 68.4  Temp (24hrs), Avg:100.8 F (38.2 C), Min:100 F (37.8 C), Max:102.2 F (39 C)  Recent Labs  Lab 12/24/18 0826 12/24/18 0950  WBC 17.9*  --   CREATININE 1.59*  --   LATICACIDVEN  --  1.8    Estimated Creatinine Clearance: 72.3 mL/min (A) (by C-G formula based on SCr of 1.59 mg/dL (H)).    Allergies  Allergen Reactions  . Amoxil [Amoxicillin] Rash    Antimicrobials this admission: 7/22 Metronidazole >> 7/22 CTX x1 dose 7/22 Ciprofloxacin >>  Dose adjustments this admission: N/A  Microbiology results: 7/22 BCx: pending     Thank you for allowing pharmacy to be a part of this patient's care.  Rowland Lathe 12/24/2018 12:23 PM

## 2018-12-24 NOTE — ED Notes (Signed)
Patient transported to X-ray 

## 2018-12-24 NOTE — ED Triage Notes (Signed)
Pt c/o HA with N/V/D that started yesterday.

## 2018-12-24 NOTE — H&P (Addendum)
Patient seen and examined, agree with detailed note below. Patient presentation and plan discussed  And agree above. [facility] [place] NAME: Douglas Greer    MR#:  [id number]  DATE OF BIRTH:  01/26/1991  DATE OF ADMISSION:  12/24/2018  PRIMARY CARE PHYSICIAN: Arnetha Courser, MD   REQUESTING/REFERRING PHYSICIAN: Duffy Bruce, MD  CHIEF COMPLAINT:   Chief Complaint  Patient presents with  . Diarrhea  . Emesis    HISTORY OF PRESENT ILLNESS:  28 y.o. male with pertinent past medical history of with significant history of chronic cervicalgia, tobacco use, elevated hemoglobin, elevated bilirubin tongue abnormality per PCP presenting to the ED with chief complaints of abdominal pain.  Patient reports onset of symptoms with progressive worsening since yesterday.  Describes symptoms of cramping abdominal pain associated with nausea vomiting, diarrhea, generalized body aches, fevers and chills, and headache.  Patient states that his diarrhea are none bloody but watery.  He has not been able to tolerate p.o. intake.  Patient denies travel hx other than outside in the public frequently, sick contacts, day care, dietary (undercooked, dairy, seafood), risky sexual exposures, rash, recent antibiotics or chemotherapy.  On arrival to the ED, he was febrile temp 102.2 with blood pressure 142/99 mm Hg and pulse rate 76 beats/min. There were no focal neurological deficits; he was alert and oriented x4 but appeared toxic on exam.  Labs revealed elevated WBC 17.9, hemoglobin 17.7 at baseline, platelets 129, potassium 3.7, creatinine 1.59, total bilirubin 2.3, normal lipase, UA with large hemoglobin otherwise negative for UTI.  Lactic acid 1.8, GI panel positive for Salmonella.  CT abdomen pelvis with contrast consistent with diffuse colitis with no evidence of bowel obstruction. Patient will be admitted under hospitalist service for further  management.  PAST MEDICAL HISTORY:   Past Medical History:  Diagnosis Date  . Low HDL (under 40) 11/26/2015  . Tobacco use 11/27/2015    PAST SURGICAL HISTORY:   Past Surgical History:  Procedure Laterality Date  . INCISIONAL HERNIA REPAIR    . NASAL FRACTURE SURGERY      SOCIAL HISTORY:   Social History   Tobacco Use  . Smoking status: Current Every Day Smoker    Packs/day: 1.00    Types: Cigarettes  . Smokeless tobacco: Never Used  Substance Use Topics  . Alcohol use: Yes    Alcohol/week: 0.0 standard drinks    Comment: occassional    FAMILY HISTORY:   Family History  Problem Relation Age of Onset  . Hypertension Mother   . Diabetes Father   . Asthma Son   . Allergies Son        peanuts    DRUG ALLERGIES:   Allergies  Allergen Reactions  . Amoxil [Amoxicillin] Rash    REVIEW OF SYSTEMS:   Review of Systems  Constitutional: Positive for chills and fever. Negative for malaise/fatigue and weight loss.  HENT: Negative for congestion, hearing loss and sore throat.   Eyes: Negative for blurred vision and double vision.  Respiratory: Negative for cough, shortness of breath and wheezing.   Cardiovascular: Negative for chest pain, palpitations, orthopnea and leg swelling.  Gastrointestinal: Positive for abdominal pain, diarrhea, nausea and vomiting.  Genitourinary: Negative for dysuria and urgency.  Musculoskeletal: Positive for myalgias.  Skin: Negative for rash.  Neurological: Positive for headaches. Negative for dizziness, sensory change, speech change and focal weakness.  Psychiatric/Behavioral: Negative for depression.   MEDICATIONS AT HOME:   Prior to  Admission medications   Medication Sig Start Date End Date Taking? Authorizing Provider  Melatonin 5 MG TABS Take 5 mg by mouth at bedtime as needed (sleep).   Yes [provider]      VITAL SIGNS:  Blood pressure 111/64, pulse 95, temperature 99.9 F (37.7 C), temperature source Oral,  resp. rate 17, height 5\' 8"  (1.727 m), weight 83.9 kg, SpO2 100 %.  PHYSICAL EXAMINATION:   Physical Exam  GENERAL:  28 y.o.-year-old patient lying in the bed with no acute distress.  EYES: Pupils equal, round, reactive to light and accommodation. No scleral icterus. Extraocular muscles intact.  HEENT: Head atraumatic, normocephalic. Oropharynx and nasopharynx clear.  NECK:  Supple, no jugular venous distention. No thyroid enlargement, no tenderness.  LUNGS: Normal breath sounds bilaterally, no wheezing, rales,rhonchi or crepitation. No use of accessory muscles of respiration.  CARDIOVASCULAR: S1, S2 normal. No murmurs, rubs, or gallops.  ABDOMEN: Soft, tender to palpation, Mildly distended. Bowel sounds present. No organomegaly or mass.  EXTREMITIES: No pedal edema, cyanosis, or clubbing. No rash or lesions. + pedal pulses MUSCULOSKELETAL: Normal bulk, and power was 5+ grip and elbow, knee, and ankle flexion and extension bilaterally.  NEUROLOGIC:Alert and oriented x 3. CN 2-12 intact. Sensation to light touch and cold stimuli intact bilaterally. Finger to nose nl. Babinski is downgoing. DTR's (biceps, patellar, and achilles) 2+ and symmetric throughout. Gait not tested due to safety concern. PSYCHIATRIC: The patient is alert and oriented x 3.  SKIN: No obvious rash, lesion, or ulcer.   DATA REVIEWED:  LABORATORY PANEL:   CBC Recent Labs  Lab 12/24/18 0826  WBC 17.9*  HGB 17.7*  HCT 49.9  PLT 129*   ------------------------------------------------------------------------------------------------------------------  Chemistries  Recent Labs  Lab 12/24/18 0826  NA 135  K 2.7*  CL 98  CO2 24  GLUCOSE 107*  BUN 14  CREATININE 1.59*  CALCIUM 8.7*  AST 29  ALT 29  ALKPHOS 46  BILITOT 2.3*   ------------------------------------------------------------------------------------------------------------------  Cardiac Enzymes No results for input(s): TROPONINI in the last 168  hours. ------------------------------------------------------------------------------------------------------------------  RADIOLOGY:  Ct Abdomen Pelvis W Contrast  Result Date: 12/24/2018 CLINICAL DATA:  Nausea, vomiting and diarrhea for 24 hours. Elevated white blood cell count. EXAM: CT ABDOMEN AND PELVIS WITH CONTRAST TECHNIQUE: Multidetector CT imaging of the abdomen and pelvis was performed using the standard protocol following bolus administration of intravenous contrast. CONTRAST:  100mL OMNIPAQUE IOHEXOL 300 MG/ML  SOLN COMPARISON:  X-ray of chest and abdomen December 24, 2018 FINDINGS: Lower chest: No acute abnormality. Hepatobiliary: No focal liver abnormality is seen. No gallstones, gallbladder wall thickening, or biliary dilatation. Pancreas: Unremarkable. No pancreatic ductal dilatation or surrounding inflammatory changes. Spleen: Normal in size without focal abnormality. Adrenals/Urinary Tract: Adrenal glands are unremarkable. Kidneys are normal, without renal calculi, focal lesion, or hydronephrosis. Bladder is unremarkable. Stomach/Bowel: There are thickened bowel wall small bowel loops in the distal ileum. There is thick walled right and transverse colon with surrounding stranding inflammation. There is no small bowel obstruction. The appendix is normal. Vascular/Lymphatic: No significant vascular findings are present. No enlarged abdominal or pelvic lymph nodes. Reproductive: Prostate is unremarkable. Other: No abdominal wall hernia or abnormality. No abdominopelvic ascites. Musculoskeletal: No acute or significant osseous findings. IMPRESSION: Thick wall transverse and right colon with surrounding inflammation. Thick walled distal ileum. The findings are nonspecific, differential diagnosis and occludes infectious/inflammatory etiology, including inflammatory bowel disease. There is no evidence of bowel obstruction. Electronically Signed   By: Sherian ReinWei-Chen  Lin  M.D.   On: 12/24/2018 10:55   Dg  Abdomen Acute W/chest  Result Date: 12/24/2018 CLINICAL DATA:  Nausea, vomiting and diarrhea for 1 day. EXAM: DG ABDOMEN ACUTE W/ 1V CHEST COMPARISON:  None. FINDINGS: There are few air-filled mild dilated small bowel loops in the abdomen with question bowel wall thickening. There is relative paucity of bowel gas. No radiopaque calculi or other significant radiographic abnormality is seen. Heart size and mediastinal contours are within normal limits. Both lungs are clear. IMPRESSION: A few air-filled mildly dilated small bowel loops in the abdomen with question bowel wall thickening. Consider further evaluation with CT abdomen pelvis with contrast. No acute cardiopulmonary disease. Electronically Signed   By: Sherian ReinWei-Chen  Lin M.D.   On: 12/24/2018 09:26    EKG:  EKG: there are no previous tracings available for comparison.  IMPRESSION AND PLAN:   28 y.o. male with significant history of chronic cervicalgia, tobacco use, elevated hemoglobin, elevated bilirubin tongue abnormality per PCP presenting to the ED with chief complaints of abdominal pain associated with nausea, diarrhea, vomiting and headache.  1. Sepsis - Patient meets SIRS criteria,Heart Rate 106 beats/minute, Temperature 102.2. Also with elevated white count 17.9 - Suspect secondary to infectious Salmonella - CT abdomen consistent with diffuse colitis - GI panel positive for Salmonella, will check for sensitivity - Lactic acid 1.8 - Covid negative - UA with large hgb no evidence of UTI - Urine cultures pending - Blood cultures pending - Empiric abx with Cipro and Flagyl. However due to +salmonella Flagyl was discontinued. - IVFs and PRN bolus to keep MAP<565mmHg or SBP <8490mmHg - GI consult placed. Discussed with Dr. Norma Fredricksonoledo who will see the patient  2. Acute colitis -likely due to infectious Salmonella as above - PRN pain management - PRN Zofran for nausea - PO and IV rehydration and electrolyte repletion  3.  Mild AKI -  Intrinsic from Sepsis? - Avoid nephrotoxins - IVFs - Continue to monitor renal function  4.  Hypokalemia - 2.7 on admission - Repleted in the ED with IV potassium - Recheck now then in am + mag - Will continue PO supplements  5. Elevated bilirubin  - CT abdomen as above with no evidence of obstruction  6. Chronic elevated hemoglobin - Following with oncology Dr. Cathie HoopsYu   Abdominal pain -CT abdomen shows diffuse colitis  All the records are reviewed and case discussed with ED provider. Management plans discussed with the patient, family and they are in agreement.  CODE STATUS: FULL  TOTAL TIME TAKING CARE OF THIS PATIENT: 50 minutes.     12/24/2018 at 4:44 PM   Webb SilversmithElizabeth Ouma, DNP, FNP-BC [facility] Nurse Practitioner Between 7am to 6pm - Pager [phone]- [phone]  After 6pm go to [email or web address] - Social research officer, governmentpassword [email or web address]  [facility]  Office  [phone]  CC: Primary care physician; Kerman PasseyLada, Melinda P, MD

## 2018-12-25 LAB — CBC WITH DIFFERENTIAL/PLATELET
Abs Immature Granulocytes: 0.08 10*3/uL — ABNORMAL HIGH (ref 0.00–0.07)
Basophils Absolute: 0 10*3/uL (ref 0.0–0.1)
Basophils Relative: 0 %
Eosinophils Absolute: 0 10*3/uL (ref 0.0–0.5)
Eosinophils Relative: 0 %
HCT: 43.9 % (ref 39.0–52.0)
Hemoglobin: 15.8 g/dL (ref 13.0–17.0)
Immature Granulocytes: 1 %
Lymphocytes Relative: 11 %
Lymphs Abs: 1.1 10*3/uL (ref 0.7–4.0)
MCH: 34.2 pg — ABNORMAL HIGH (ref 26.0–34.0)
MCHC: 36 g/dL (ref 30.0–36.0)
MCV: 95 fL (ref 80.0–100.0)
Monocytes Absolute: 0.6 10*3/uL (ref 0.1–1.0)
Monocytes Relative: 6 %
Neutro Abs: 8.3 10*3/uL — ABNORMAL HIGH (ref 1.7–7.7)
Neutrophils Relative %: 82 %
Platelets: 101 10*3/uL — ABNORMAL LOW (ref 150–400)
RBC: 4.62 MIL/uL (ref 4.22–5.81)
RDW: 13.2 % (ref 11.5–15.5)
WBC: 10.2 10*3/uL (ref 4.0–10.5)
nRBC: 0 % (ref 0.0–0.2)

## 2018-12-25 LAB — COMPREHENSIVE METABOLIC PANEL
ALT: 27 U/L (ref 0–44)
AST: 39 U/L (ref 15–41)
Albumin: 3.9 g/dL (ref 3.5–5.0)
Alkaline Phosphatase: 44 U/L (ref 38–126)
Anion gap: 9 (ref 5–15)
BUN: 11 mg/dL (ref 6–20)
CO2: 21 mmol/L — ABNORMAL LOW (ref 22–32)
Calcium: 8 mg/dL — ABNORMAL LOW (ref 8.9–10.3)
Chloride: 106 mmol/L (ref 98–111)
Creatinine, Ser: 1.06 mg/dL (ref 0.61–1.24)
GFR calc Af Amer: >60 mL/min (ref >60)
GFR calc non Af Amer: >60 mL/min (ref >60)
Glucose, Bld: 105 mg/dL — ABNORMAL HIGH (ref 70–99)
Potassium: 3.1 mmol/L — ABNORMAL LOW (ref 3.5–5.1)
Sodium: 136 mmol/L (ref 135–145)
Total Bilirubin: 1.6 mg/dL — ABNORMAL HIGH (ref 0.3–1.2)
Total Protein: 7 g/dL (ref 6.5–8.1)

## 2018-12-25 LAB — MAGNESIUM: Magnesium: 2.1 mg/dL (ref 1.7–2.4)

## 2018-12-25 MED ORDER — CIPROFLOXACIN HCL 500 MG PO TABS: 500.0000 mg | ORAL_TABLET | Freq: Two times a day (BID) | ORAL | 0 refills | Status: DC

## 2018-12-25 MED ORDER — CIPROFLOXACIN HCL 500 MG PO TABS
500.0000 mg | ORAL_TABLET | Freq: Two times a day (BID) | ORAL | Status: DC
  Administered 2018-12-25: 500 mg via ORAL
  Filled 2018-12-25: qty 1

## 2018-12-25 MED ORDER — POTASSIUM CHLORIDE CRYS ER 20 MEQ PO TBCR
40.0000 meq | EXTENDED_RELEASE_TABLET | Freq: Once | ORAL | Status: AC
  Administered 2018-12-25: 40 meq via ORAL
  Filled 2018-12-25: qty 2

## 2018-12-25 NOTE — Discharge Summary (Signed)
[facility] [place] [facility]   PATIENT NAME: Douglas Greer    MR#:  [id number]  DATE OF BIRTH:  1990/10/25  DATE OF ADMISSION:  12/24/2018 ADMITTING PHYSICIAN: Jimmye NormanElizabeth Achieng Ouma, NP  DATE OF DISCHARGE:   PRIMARY CARE PHYSICIAN: Kerman PasseyLada, Melinda P, MD    ADMISSION DIAGNOSIS:  Dehydration [E86.0] Hypokalemia [E87.6] Sepsis without acute organ dysfunction, due to unspecified organism (HCC) [A41.9]  DISCHARGE DIAGNOSIS:  Sepsis due to acute Colitis--salmonella  SECONDARY DIAGNOSIS:   Past Medical History:  Diagnosis Date  . Low HDL (under 40) 11/26/2015  . Tobacco use 11/27/2015    HOSPITAL COURSE:  28 y.o. male with significant history of chronic cervicalgia, tobacco use, elevated hemoglobin, elevated bilirubin tongue abnormality per PCP presenting to the ED with chief complaints of abdominal pain associated with nausea, diarrhea, vomiting and headache.  1. Sepsis - Patient meets SIRS criteria,Heart Rate 106 beats/minute, Temperature 102.2. Also with elevated white count 17.9 - Suspect secondary to infectious Salmonella - CT abdomen consistent with diffuse colitis - GI panel positive for Salmonella - Lactic acid 1.8 - Covid negative - UA with large hgb no evidence of UTI -patient feels better. Will switch to PO Cipro. He wishes to go home. I have asked him to be on full liquid diet and keep fluids in. He'll follow-up with G.I. as needed -appreciate G.I. consultation with Dr. Norma Fredricksonoledo  2. Acute colitis -likely due to infectious Salmonella as above - PRN pain management - PRN Zofran for nausea -received IV fluids for hydration  3.  Mild AKI -received IVFs  4.  Hypokalemia - 2.7 on admission-- 3.4-- 3.1-- patient got repletion  5. Chronic elevated hemoglobin - Following with oncology Dr. Cathie HoopsYu  Overall improving. Patient was asked to be in full liquid diet for next couple days. He will finish course of Cipro as outpatient follow-up  with PCP as needed. Patient will discharged to home upon his request.  G.I. has been informed.  CONSULTS OBTAINED:  Treatment Team:  Stanton Kidneyoledo, Teodoro K, MD  DRUG ALLERGIES:   Allergies  Allergen Reactions  . Amoxil [Amoxicillin] Rash    DISCHARGE MEDICATIONS:   Allergies as of 12/25/2018      Reactions   Amoxil [amoxicillin] Rash      Medication List    TAKE these medications   ciprofloxacin 500 MG tablet Commonly known as: CIPRO Take 1 tablet (500 mg total) by mouth 2 (two) times daily.   Melatonin 5 MG Tabs Take 5 mg by mouth at bedtime as needed (sleep).       If you experience worsening of your admission symptoms, develop shortness of breath, life threatening emergency, suicidal or homicidal thoughts you must seek medical attention immediately by calling 911 or calling your MD immediately  if symptoms less severe.  You Must read complete instructions/literature along with all the possible adverse reactions/side effects for all the Medicines you take and that have been prescribed to you. Take any new Medicines after you have completely understood and accept all the possible adverse reactions/side effects.   Please note  You were cared for by a hospitalist during your hospital stay. If you have any questions about your discharge medications or the care you received while you were in the hospital after you are discharged, you can call the unit and asked to speak with the hospitalist on call if the hospitalist that took care of you is not available. Once you are discharged, your primary care physician will handle  any further medical issues. Please note that NO REFILLS for any discharge medications will be authorized once you are discharged, as it is imperative that you return to your primary care physician (or establish a relationship with a primary care physician if you do not have one) for your aftercare needs so that they can reassess your need for medications and monitor  your lab values. Today   SUBJECTIVE   I feel better than yesterday.  VITAL SIGNS:  Blood pressure (!) 105/59, pulse 82, temperature (!) 100.7 F (38.2 C), temperature source Oral, resp. rate 20, height 5\' 8"  (1.727 m), weight 83.9 kg, SpO2 98 %.  I/O:    Intake/Output Summary (Last 24 hours) at 12/25/2018 1318 Last data filed at 12/25/2018 1009 Gross per 24 hour  Intake 2204.6 ml  Output -  Net 2204.6 ml    PHYSICAL EXAMINATION:  GENERAL:  28 y.o.-year-old patient lying in the bed with no acute distress.  EYES: Pupils equal, round, reactive to light and accommodation. No scleral icterus. Extraocular muscles intact.  HEENT: Head atraumatic, normocephalic. Oropharynx and nasopharynx clear.  NECK:  Supple, no jugular venous distention. No thyroid enlargement, no tenderness.  LUNGS: Normal breath sounds bilaterally, no wheezing, rales,rhonchi or crepitation. No use of accessory muscles of respiration.  CARDIOVASCULAR: S1, S2 normal. No murmurs, rubs, or gallops.  ABDOMEN: Soft, non-tender, non-distended. Bowel sounds present. No organomegaly or mass.  EXTREMITIES: No pedal edema, cyanosis, or clubbing.  NEUROLOGIC: Cranial nerves II through XII are intact. Muscle strength 5/5 in all extremities. Sensation intact. Gait not checked.  PSYCHIATRIC: The patient is alert and oriented x 3.  SKIN: No obvious rash, lesion, or ulcer.   DATA REVIEW:   CBC  Recent Labs  Lab 12/25/18 0508  WBC 10.2  HGB 15.8  HCT 43.9  PLT 101*    Chemistries  Recent Labs  Lab 12/25/18 0508  NA 136  K 3.1*  CL 106  CO2 21*  GLUCOSE 105*  BUN 11  CREATININE 1.06  CALCIUM 8.0*  MG 2.1  AST 39  ALT 27  ALKPHOS 44  BILITOT 1.6*    Microbiology Results   Recent Results (from the past 240 hour(s))  SARS Coronavirus 2 (CEPHEID - Performed in [facility] hospital lab), Hosp Order     Status: None   Collection Time: 12/24/18  8:26 AM   Specimen: Nasopharyngeal Swab  Result Value Ref Range  Status   SARS Coronavirus 2 NEGATIVE NEGATIVE Final    Comment: (NOTE) If result is NEGATIVE SARS-CoV-2 target nucleic acids are NOT DETECTED. The SARS-CoV-2 RNA is generally detectable in upper and lower  respiratory specimens during the acute phase of infection. The lowest  concentration of SARS-CoV-2 viral copies this assay can detect is 250  copies / mL. A negative result does not preclude SARS-CoV-2 infection  and should not be used as the sole basis for treatment or other  patient management decisions.  A negative result may occur with  improper specimen collection / handling, submission of specimen other  than nasopharyngeal swab, presence of viral mutation(s) within the  areas targeted by this assay, and inadequate number of viral copies  (<250 copies / mL). A negative result must be combined with clinical  observations, patient history, and epidemiological information. If result is POSITIVE SARS-CoV-2 target nucleic acids are DETECTED. The SARS-CoV-2 RNA is generally detectable in upper and lower  respiratory specimens dur ing the acute phase of infection.  Positive  results are indicative of  active infection with SARS-CoV-2.  Clinical  correlation with patient history and other diagnostic information is  necessary to determine patient infection status.  Positive results do  not rule out bacterial infection or co-infection with other viruses. If result is PRESUMPTIVE POSTIVE SARS-CoV-2 nucleic acids MAY BE PRESENT.   A presumptive positive result was obtained on the submitted specimen  and confirmed on repeat testing.  While 2019 novel coronavirus  (SARS-CoV-2) nucleic acids may be present in the submitted sample  additional confirmatory testing may be necessary for epidemiological  and / or clinical management purposes  to differentiate between  SARS-CoV-2 and other Sarbecovirus currently known to infect humans.  If clinically indicated additional testing with an alternate  test  methodology [phone]) is advised. The SARS-CoV-2 RNA is generally  detectable in upper and lower respiratory sp ecimens during the acute  phase of infection. The expected result is Negative. Fact Sheet for Patients:  StrictlyIdeas.no Fact Sheet for Healthcare Providers: BankingDealers.co.za This test is not yet approved or cleared by the [place] FDA and has been authorized for detection and/or diagnosis of SARS-CoV-2 by FDA under an Emergency Use Authorization (EUA).  This EUA will remain in effect (meaning this test can be used) for the duration of the COVID-19 declaration under Section 564(b)(1) of the Act, 21 U.S.C. section 360bbb-3(b)(1), unless the authorization is terminated or revoked sooner. Performed at [facility], [address] [place], [place] [address]   Gastrointestinal Panel by PCR , Stool     Status: Abnormal   Collection Time: 12/24/18 12:58 PM   Specimen: Stool  Result Value Ref Range Status   Campylobacter species NOT DETECTED NOT DETECTED Final   Plesimonas shigelloides NOT DETECTED NOT DETECTED Final   Salmonella species DETECTED (A) NOT DETECTED Final    Comment: RESULT CALLED TO, READ BACK BY AND VERIFIED WITH: MOLLY WEISMILLER AT 1451 ON 12/24/2018 Arnold Line.    Yersinia enterocolitica NOT DETECTED NOT DETECTED Final   Vibrio species NOT DETECTED NOT DETECTED Final   Vibrio cholerae NOT DETECTED NOT DETECTED Final   Enteroaggregative E coli (EAEC) NOT DETECTED NOT DETECTED Final   Enteropathogenic E coli (EPEC) NOT DETECTED NOT DETECTED Final   Enterotoxigenic E coli (ETEC) NOT DETECTED NOT DETECTED Final   Shiga like toxin producing E coli (STEC) NOT DETECTED NOT DETECTED Final   Shigella/Enteroinvasive E coli (EIEC) NOT DETECTED NOT DETECTED Final   Cryptosporidium NOT DETECTED NOT DETECTED Final   Cyclospora cayetanensis NOT DETECTED NOT DETECTED Final   Entamoeba histolytica NOT DETECTED  NOT DETECTED Final   Giardia lamblia NOT DETECTED NOT DETECTED Final   Adenovirus F40/41 NOT DETECTED NOT DETECTED Final   Astrovirus NOT DETECTED NOT DETECTED Final   Norovirus GI/GII NOT DETECTED NOT DETECTED Final   Rotavirus A NOT DETECTED NOT DETECTED Final   Sapovirus (I, II, IV, and V) NOT DETECTED NOT DETECTED Final    Comment: Performed at [facility], [address] [place], [place] [address]  C difficile quick scan w PCR reflex     Status: None   Collection Time: 12/24/18 12:58 PM   Specimen: Stool  Result Value Ref Range Status   C Diff antigen NEGATIVE NEGATIVE Final   C Diff toxin NEGATIVE NEGATIVE Final   C Diff interpretation No C. difficile detected.  Final    Comment: Performed at [facility], [address] [place], [place] [address]    RADIOLOGY:  Ct Abdomen Pelvis W Contrast  Result Date: 12/24/2018  CLINICAL DATA:  Nausea, vomiting and diarrhea for 24 hours. Elevated white blood cell count. EXAM: CT ABDOMEN AND PELVIS WITH CONTRAST TECHNIQUE: Multidetector CT imaging of the abdomen and pelvis was performed using the standard protocol following bolus administration of intravenous contrast. CONTRAST:  100mL OMNIPAQUE IOHEXOL 300 MG/ML  SOLN COMPARISON:  X-ray of chest and abdomen December 24, 2018 FINDINGS: Lower chest: No acute abnormality. Hepatobiliary: No focal liver abnormality is seen. No gallstones, gallbladder wall thickening, or biliary dilatation. Pancreas: Unremarkable. No pancreatic ductal dilatation or surrounding inflammatory changes. Spleen: Normal in size without focal abnormality. Adrenals/Urinary Tract: Adrenal glands are unremarkable. Kidneys are normal, without renal calculi, focal lesion, or hydronephrosis. Bladder is unremarkable. Stomach/Bowel: There are thickened bowel wall small bowel loops in the distal ileum. There is thick walled right and transverse colon with surrounding stranding inflammation. There is no small bowel  obstruction. The appendix is normal. Vascular/Lymphatic: No significant vascular findings are present. No enlarged abdominal or pelvic lymph nodes. Reproductive: Prostate is unremarkable. Other: No abdominal wall hernia or abnormality. No abdominopelvic ascites. Musculoskeletal: No acute or significant osseous findings. IMPRESSION: Thick wall transverse and right colon with surrounding inflammation. Thick walled distal ileum. The findings are nonspecific, differential diagnosis and occludes infectious/inflammatory etiology, including inflammatory bowel disease. There is no evidence of bowel obstruction. Electronically Signed   By: Sherian ReinWei-Chen  Lin M.D.   On: 12/24/2018 10:55   Dg Abdomen Acute W/chest  Result Date: 12/24/2018 CLINICAL DATA:  Nausea, vomiting and diarrhea for 1 day. EXAM: DG ABDOMEN ACUTE W/ 1V CHEST COMPARISON:  None. FINDINGS: There are few air-filled mild dilated small bowel loops in the abdomen with question bowel wall thickening. There is relative paucity of bowel gas. No radiopaque calculi or other significant radiographic abnormality is seen. Heart size and mediastinal contours are within normal limits. Both lungs are clear. IMPRESSION: A few air-filled mildly dilated small bowel loops in the abdomen with question bowel wall thickening. Consider further evaluation with CT abdomen pelvis with contrast. No acute cardiopulmonary disease. Electronically Signed   By: Sherian ReinWei-Chen  Lin M.D.   On: 12/24/2018 09:26     CODE STATUS:     Code Status Orders  (From admission, onward)         Start     Ordered   12/24/18 1200  Full code  Continuous     12/24/18 1203        Code Status History    This patient has a current code status but no historical code status.   Advance Care Planning Activity      TOTAL TIME TAKING CARE OF THIS PATIENT: 40 minutes.    Enedina FinnerSona Sukhdeep Wieting M.D on 12/25/2018 at 1:18 PM  Between 7am to 6pm - Pager - [phone] After 6pm go to [email or web address] - [email or web address]  [facility]  [facility]  Office  [phone]  CC: Primary care physician; Kerman PasseyLada, Melinda P, MD

## 2018-12-25 NOTE — Progress Notes (Signed)
Douglas Greer to be D/C'd Home per MD order.  Discussed prescriptions and follow up appointments with the patient. Prescriptions given to patient, medication list explained in detail. Pt verbalized understanding.  Allergies as of 12/25/2018      Reactions   Amoxil [amoxicillin] Rash      Medication List    TAKE these medications   ciprofloxacin 500 MG tablet Commonly known as: CIPRO Take 1 tablet (500 mg total) by mouth 2 (two) times daily.   Melatonin 5 MG Tabs Take 5 mg by mouth at bedtime as needed (sleep).       Vitals:   12/25/18 0614 12/25/18 1348  BP: (!) 105/59 122/83  Pulse: 82 77  Resp:  16  Temp: (!) 100.7 F (38.2 C) 98.4 F (36.9 C)  SpO2: 98% 100%    Skin clean, dry and intact without evidence of skin break down, no evidence of skin tears noted. IV catheter discontinued intact. Site without signs and symptoms of complications. Dressing and pressure applied. Pt denies pain at this time. No complaints noted.  An After Visit Summary was printed and given to the patient. Patient escorted via [place], and D/C home via private auto.  Fuller Mandril, RN

## 2018-12-26 LAB — HIV ANTIBODY (ROUTINE TESTING W REFLEX): HIV Screen 4th Generation wRfx: NONREACTIVE

## 2018-12-29 LAB — CULTURE, BLOOD (ROUTINE X 2)
Culture: NO GROWTH
Culture: NO GROWTH
Special Requests: ADEQUATE
Special Requests: ADEQUATE

## 2019-02-11 ENCOUNTER — Encounter: Payer: PRIVATE HEALTH INSURANCE | Admitting: Family Medicine

## 2019-02-11 ENCOUNTER — Encounter: Payer: Self-pay | Admitting: Family Medicine

## 2019-02-11 ENCOUNTER — Other Ambulatory Visit: Payer: Self-pay

## 2019-02-11 ENCOUNTER — Ambulatory Visit (INDEPENDENT_AMBULATORY_CARE_PROVIDER_SITE_OTHER): Payer: PRIVATE HEALTH INSURANCE | Admitting: Family Medicine

## 2019-02-11 VITALS — BP 118/72 | HR 98 | Temp 98.3°F | Resp 14 | Ht 68.0 in | Wt 187.0 lb

## 2019-02-11 DIAGNOSIS — Z09 Encounter for follow-up examination after completed treatment for conditions other than malignant neoplasm: Secondary | ICD-10-CM

## 2019-02-11 DIAGNOSIS — E876 Hypokalemia: Secondary | ICD-10-CM | POA: Diagnosis not present

## 2019-02-11 DIAGNOSIS — Z0000 Encounter for general adult medical examination without abnormal findings: Secondary | ICD-10-CM

## 2019-02-11 DIAGNOSIS — N179 Acute kidney failure, unspecified: Secondary | ICD-10-CM

## 2019-02-11 DIAGNOSIS — Z720 Tobacco use: Secondary | ICD-10-CM

## 2019-02-11 DIAGNOSIS — D696 Thrombocytopenia, unspecified: Secondary | ICD-10-CM

## 2019-02-11 DIAGNOSIS — E786 Lipoprotein deficiency: Secondary | ICD-10-CM

## 2019-02-11 DIAGNOSIS — R17 Unspecified jaundice: Secondary | ICD-10-CM

## 2019-02-11 DIAGNOSIS — E7849 Other hyperlipidemia: Secondary | ICD-10-CM

## 2019-02-11 DIAGNOSIS — E663 Overweight: Secondary | ICD-10-CM

## 2019-02-11 MED ORDER — VARENICLINE TARTRATE 1 MG PO TABS: 1.0000 mg | ORAL_TABLET | Freq: Two times a day (BID) | ORAL | 4 refills | Status: DC

## 2019-02-11 MED ORDER — CHANTIX STARTING MONTH PAK 0.5 MG X 11 & 1 MG X 42 PO TABS: ORAL_TABLET | ORAL | 0 refills | Status: DC

## 2019-02-11 NOTE — Progress Notes (Signed)
Patient: Douglas Greer, Male    DOB: [birth year], 28 y.o.   MRN: [id number] Danelle Berry, PA-C Visit Date: 02/12/2019  Today's Provider: Danelle Berry, PA-C   Chief Complaint  Patient presents with  . Annual Exam   Subjective:   Annual physical exam:  Douglas Greer is a 28 y.o. male who presents today for health maintenance and annual & complete physical exam.  He also was hospitalized in July and has not been seen since then, also has not followed up with GI specialist as directed.  He feels well.  He reports not currently exercising Diet generally described by pt as "ok" he has been gaining weight back since hospitalization and not following any particular diet. He reports he is sleeping well.  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  CT scan showed inflamed colon (both large and small) patient also had electrolyte derangements and mild AKI.  He was encouraged to see gastroenterologist after discharge but he has not gone and does not really want to go.  His appetite bowel movements and his weight have all been returning to his normal.  He denies any abdominal pain any food sensitivities, denies nausea vomiting constipation, diarrhea, hematochezia, melena.  Per chart review he does have a history of elevated bilirubin and PCP had previously suspected Gilbert's disease and also referred him to GI.  Last labs were earlier this year with Dr. Sherie Don who has since left the practice.  Patient had a abnormal lipid panel and liver enzymes and was instructed to do a vegetarian or vegan diet and return in a few weeks for repeat testing, but he was lost to follow-up.  Patient is a current smoker, has been smoking 1 pack a day of regular cigarettes for about 12 years, is willing to try and quit, tried chantix in the past and is willing to try again, no SE or concerns with the medicine.   He denies any daily cough, chest pain,  shortness of breath, wheeze or frequent or recurrent bronchitis. He has had high hemoglobin and hematocrit in the past, secondary to smoking Also hx of low HDL, last PCP noted it was also secondary to smoking  USPSTF grade A and B recommendations - reviewed and addressed today  Depression:  Phq 9 completed today by patient, was reviewed by me with patient in the room, score is  negative, pt feels good. PHQ 2/9 Scores 02/11/2019 08/11/2018 01/24/2018 09/27/2017  PHQ - 2 Score 0 0 0 0  PHQ- 9 Score 0 0 - -   Depression screen [facility] 2/9 02/11/2019 08/11/2018 01/24/2018 09/27/2017 06/21/2017  Decreased Interest 0 0 0 0 0  Down, Depressed, Hopeless 0 0 0 0 0  PHQ - 2 Score 0 0 0 0 0  Altered sleeping 0 0 - - -  Tired, decreased energy 0 0 - - -  Change in appetite 0 0 - - -  Feeling bad or failure about yourself  0 0 - - -  Trouble concentrating 0 0 - - -  Moving slowly or fidgety/restless 0 0 - - -  Suicidal thoughts 0 0 - - -  PHQ-9 Score 0 0 - - -  Difficult doing work/chores Not difficult at all Not difficult at all - - -    STD testing and prevention (HIV/chl/gon/syphilis): done last year, pt states he is not want retesting. Intimate partner violence: Patient feels safe at home  Skin cancer: No history, no concerning  lesions at this time  Lung cancer:  N/A  Social History   Tobacco Use  . Smoking status: Current Every Day Smoker    Packs/day: 1.00    Years: 12.00    Pack years: 12.00    Types: Cigarettes  . Smokeless tobacco: Never Used  Substance Use Topics  . Alcohol use: Yes    Alcohol/week: 0.0 standard drinks    Comment: occassional     Alcohol screening:   Office Visit from 02/11/2019 in [facility]  AUDIT-C Score  1     Blood pressure/Hypertension: BP Readings from Last 3 Encounters:  02/11/19 118/72  12/25/18 122/83  08/11/18 116/70   Weight/Obesity: Wt Readings from Last 3 Encounters:  02/11/19 187 lb (84.8 kg)  12/24/18 185 lb (83.9 kg)   08/11/18 195 lb 4.8 oz (88.6 kg)   BMI Readings from Last 3 Encounters:  02/11/19 28.43 kg/m  12/24/18 28.13 kg/m  08/11/18 29.70 kg/m    Lipids:  Lab Results  Component Value Date   CHOL 165 02/11/2019   CHOL 175 08/11/2018   CHOL 173 01/24/2018   Lab Results  Component Value Date   HDL 17 (L) 02/11/2019   HDL 20 (L) 08/11/2018   HDL 18 (L) 01/24/2018   Lab Results  Component Value Date   LDLCALC 107 (H) 02/11/2019   LDLCALC 125 (H) 08/11/2018   LDLCALC 120 (H) 01/24/2018   Lab Results  Component Value Date   TRIG 304 (H) 02/11/2019   TRIG 179 (H) 08/11/2018   TRIG 231 (H) 01/24/2018   Lab Results  Component Value Date   CHOLHDL 9.7 (H) 02/11/2019   CHOLHDL 8.8 (H) 08/11/2018   CHOLHDL 9.6 (H) 01/24/2018   No results found for: LDLDIRECT Based on the results of lipid panel his/her cardiovascular risk factor ( using [place] Cohort )  in the next 10 years is : The ASCVD Risk score Denman George DC Jr., et al., 2013) failed to calculate for the following reasons:   The 2013 ASCVD risk score is only valid for ages 31 to 24 Glucose:  Glucose, Bld  Date Value Ref Range Status  02/11/2019 95 65 - 99 mg/dL Final    Comment:    .            Fasting reference interval .   12/25/2018 105 (H) 70 - 99 mg/dL Final  16/03/9603 540 (H) 70 - 99 mg/dL Final    Social History      He  reports that he has been smoking cigarettes. He has a 12.00 pack-year smoking history. He has never used smokeless tobacco. He reports current alcohol use. He reports current drug use. Drug: Marijuana.             Social History   Socioeconomic History  . Marital status: Single    Spouse name: Not on file  . Number of children: 2  . Years of education: 95  . Highest education level: Bachelor's degree (e.g., BA, AB, BS)  Occupational History  . Occupation: IT  Social Needs  . Financial resource strain: Not hard at all  . Food insecurity    Worry: Never true    Inability: Never true  .  Transportation needs    Medical: No    Non-medical: No  Tobacco Use  . Smoking status: Current Every Day Smoker    Packs/day: 1.00    Years: 12.00    Pack years: 12.00    Types: Cigarettes  .  Smokeless tobacco: Never Used  Substance and Sexual Activity  . Alcohol use: Yes    Alcohol/week: 0.0 standard drinks    Comment: occassional  . Drug use: Yes    Types: Marijuana  . Sexual activity: Yes  Lifestyle  . Physical activity    Days per week: 0 days    Minutes per session: 0 min  . Stress: Not at all  Relationships  . Social Herbalist on phone: Three times a week    Gets together: Three times a week    Attends religious service: Never    Active member of club or organization: No    Attends meetings of clubs or organizations: Never    Relationship status: Not on file  . Intimate partner violence    Fear of current or ex partner: No    Emotionally abused: No    Physically abused: No    Forced sexual activity: No  Other Topics Concern  . Not on file  Social History Narrative  . Not on file    Family History        Family Status  Relation Name Status  . Mother  Alive  . Father  Deceased  . Daughter 1 Alive  . Son 1 Alive        His family history includes Allergies in his son; Asthma in his son; Diabetes in his father; Hypertension in his mother.       Family History  Problem Relation Age of Onset  . Hypertension Mother   . Diabetes Father   . Asthma Son   . Allergies Son        peanuts    Patient Active Problem List   Diagnosis Date Noted  . Colitis presumed infectious 12/24/2018  . 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 Screen for STD (sexually transmitted disease) 08/11/2018  . Overweight 01/24/2018  . 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 Need for HPV vaccination 06/21/2017  . High hematocrit 06/21/2017  . Elevated hemoglobin ([place]) 06/21/2017  . Tobacco use 11/27/2015  . Total bilirubin, elevated 11/26/2015  .  Low HDL (under 40) 11/26/2015  . Screening for HIV (human immunodeficiency virus) 11/25/2015  . Preventative health care 11/25/2015  . Verruca plana 11/25/2015    Past Surgical History:  Procedure Laterality Date  . INCISIONAL HERNIA REPAIR    . NASAL FRACTURE SURGERY       Current Outpatient Medications:  .  ciprofloxacin (CIPRO) 500 MG tablet, Take 1 tablet (500 mg total) by mouth 2 (two) times daily. (Patient not taking: Reported on 02/11/2019), Disp: 14 tablet, Rfl: 0 .  Melatonin 5 MG TABS, Take 5 mg by mouth at bedtime as needed (sleep)., Disp: , Rfl:  .  [START ON 03/13/2019] varenicline (CHANTIX CONTINUING MONTH PAK) 1 MG tablet, Take 1 tablet (1 mg total) by mouth 2 (two) times daily., Disp: 60 tablet, Rfl: 4 .  varenicline (CHANTIX STARTING MONTH PAK) 0.5 MG X 11 & 1 MG X 42 tablet, Take 0.5 mg tablet by mouth 1x daily for 3 days, then increase to 0.5 mg tablet 2x daily for 4 days, then increase to 1 mg tablet 2x daily., Disp: 53 tablet, Rfl: 0  Allergies  Allergen Reactions  . Amoxil [Amoxicillin] Rash    Patient Care Team: Delsa Grana, PA-C as PCP - General (Family Medicine)  I personally reviewed active problem list, medication list, allergies, family history, social history, health maintenance, notes from last encounter, notes  from last several encounters, lab results, imaging with the patient/caregiver today.  Review of Systems  Constitutional: Negative.  Negative for activity change, appetite change, fatigue and unexpected weight change.  HENT: Negative.   Eyes: Negative.   Respiratory: Negative.  Negative for shortness of breath.   Cardiovascular: Negative.  Negative for chest pain, palpitations and leg swelling.  Gastrointestinal: Negative.  Negative for abdominal pain and blood in stool.  Endocrine: Negative.   Genitourinary: Negative.  Negative for decreased urine volume, difficulty urinating, testicular pain and urgency.  Skin: Negative.  Negative for color  change and pallor.  Allergic/Immunologic: Negative.   Neurological: Negative.  Negative for syncope, weakness, light-headedness and numbness.  Psychiatric/Behavioral: Negative.  Negative for confusion, dysphoric mood, self-injury and suicidal ideas. The patient is not nervous/anxious.   All other systems reviewed and are negative.         Objective:   Vitals:  Vitals:   02/11/19 1536  BP: 118/72  Pulse: 98  Resp: 14  Temp: 98.3 F (36.8 C)  SpO2: 97%  Weight: 187 lb (84.8 kg)  Height: 5\' 8"  (1.727 m)    Body mass index is 28.43 kg/m.  Physical Exam Vitals signs and nursing note reviewed.  Constitutional:      General: He is not in acute distress.    Appearance: Normal appearance. He is well-developed and overweight. He is not ill-appearing, toxic-appearing or diaphoretic.     Interventions: Face mask in place.  HENT:     Head: Normocephalic and atraumatic.     Jaw: No trismus.     Right Ear: Tympanic membrane, ear canal and external ear normal.     Left Ear: Tympanic membrane, ear canal and external ear normal.     Nose: Nose normal. No mucosal edema, congestion or rhinorrhea.     Right Sinus: No maxillary sinus tenderness or frontal sinus tenderness.     Left Sinus: No maxillary sinus tenderness or frontal sinus tenderness.     Mouth/Throat:     Mouth: Mucous membranes are moist.     Pharynx: Oropharynx is clear. Uvula midline. No oropharyngeal exudate, posterior oropharyngeal erythema or uvula swelling.  Eyes:     General: Lids are normal. No scleral icterus.    Conjunctiva/sclera: Conjunctivae normal.     Pupils: Pupils are equal, round, and reactive to light.  Neck:     Musculoskeletal: Normal range of motion and neck supple.     Thyroid: No thyromegaly.     Trachea: Trachea and phonation normal. No tracheal deviation.  Cardiovascular:     Rate and Rhythm: Normal rate and regular rhythm.     Pulses: Normal pulses.          Radial pulses are 2+ on the right  side and 2+ on the left side.       Posterior tibial pulses are 2+ on the right side and 2+ on the left side.     Heart sounds: Normal heart sounds. No murmur. No friction rub. No gallop.   Pulmonary:     Effort: Pulmonary effort is normal. No respiratory distress.     Breath sounds: Normal breath sounds. No wheezing, rhonchi or rales.  Abdominal:     General: Bowel sounds are normal. There is no distension.     Palpations: Abdomen is soft.     Tenderness: There is no abdominal tenderness. There is no right CVA tenderness, left CVA tenderness, guarding or rebound.  Musculoskeletal: Normal range of motion.  Right lower leg: No edema.     Left lower leg: No edema.  Skin:    General: Skin is warm and dry.     Capillary Refill: Capillary refill takes less than 2 seconds.     Coloration: Skin is not jaundiced or pale.     Findings: No rash.  Neurological:     Mental Status: He is alert and oriented to person, place, and time.     Gait: Gait normal.  Psychiatric:        Mood and Affect: Mood normal.        Speech: Speech normal.        Behavior: Behavior normal.      Recent Results (from the past 2160 hour(s))  CBC with Differential     Status: Abnormal   Collection Time: 12/24/18  8:26 AM  Result Value Ref Range   WBC 17.9 (H) 4.0 - 10.5 K/uL   RBC 5.15 4.22 - 5.81 MIL/uL   Hemoglobin 17.7 (H) 13.0 - 17.0 g/dL   HCT 16.149.9 09.639.0 - 04.552.0 %   MCV 96.9 80.0 - 100.0 fL   MCH 34.4 (H) 26.0 - 34.0 pg   MCHC 35.5 30.0 - 36.0 g/dL   RDW 40.913.0 81.111.5 - 91.415.5 %   Platelets 129 (L) 150 - 400 K/uL   nRBC 0.0 0.0 - 0.2 %   Neutrophils Relative % 87 %   Neutro Abs 15.7 (H) 1.7 - 7.7 K/uL   Lymphocytes Relative 7 %   Lymphs Abs 1.2 0.7 - 4.0 K/uL   Monocytes Relative 4 %   Monocytes Absolute 0.7 0.1 - 1.0 K/uL   Eosinophils Relative 1 %   Eosinophils Absolute 0.2 0.0 - 0.5 K/uL   Basophils Relative 0 %   Basophils Absolute 0.1 0.0 - 0.1 K/uL   Immature Granulocytes 1 %   Abs Immature  Granulocytes 0.13 (H) 0.00 - 0.07 K/uL    Comment: Performed at [facility], [address] [place], [place] [address]  Comprehensive metabolic panel     Status: Abnormal   Collection Time: 12/24/18  8:26 AM  Result Value Ref Range   Sodium 135 135 - 145 mmol/L   Potassium 2.7 (LL) 3.5 - 5.1 mmol/L    Comment: CRITICAL RESULT CALLED TO, READ BACK BY AND VERIFIED WITH  ALLY RILEY AT [id number] 12/24/2018 SDR    Chloride 98 98 - 111 mmol/L   CO2 24 22 - 32 mmol/L   Glucose, Bld 107 (H) 70 - 99 mg/dL   BUN 14 6 - 20 mg/dL   Creatinine, Ser 0.861.59 (H) 0.61 - 1.24 mg/dL   Calcium 8.7 (L) 8.9 - 10.3 mg/dL   Total Protein 7.9 6.5 - 8.1 g/dL   Albumin 4.6 3.5 - 5.0 g/dL   AST 29 15 - 41 U/L   ALT 29 0 - 44 U/L   Alkaline Phosphatase 46 38 - 126 U/L   Total Bilirubin 2.3 (H) 0.3 - 1.2 mg/dL   GFR calc non Af Amer 58 (L) >60 mL/min   GFR calc Af Amer >60 >60 mL/min   Anion gap 13 5 - 15    Comment: Performed at [facility], [address] [place], [place] [address]  Lipase, blood     Status: None   Collection Time: 12/24/18  8:26 AM  Result Value Ref Range   Lipase 17 11 - 51 U/L    Comment: Performed at [facility], [address] [place],  [place] [address]  SARS Coronavirus 2 (CEPHEID - Performed in [facility] hospital lab), Hosp Order     Status: None   Collection Time: 12/24/18  8:26 AM   Specimen: Nasopharyngeal Swab  Result Value Ref Range   SARS Coronavirus 2 NEGATIVE NEGATIVE    Comment: (NOTE) If result is NEGATIVE SARS-CoV-2 target nucleic acids are NOT DETECTED. The SARS-CoV-2 RNA is generally detectable in upper and lower  respiratory specimens during the acute phase of infection. The lowest  concentration of SARS-CoV-2 viral copies this assay can detect is 250  copies / mL. A negative result does not preclude SARS-CoV-2 infection  and should not be used as the sole basis for treatment or other  patient management decisions.  A negative result  may occur with  improper specimen collection / handling, submission of specimen other  than nasopharyngeal swab, presence of viral mutation(s) within the  areas targeted by this assay, and inadequate number of viral copies  (<250 copies / mL). A negative result must be combined with clinical  observations, patient history, and epidemiological information. If result is POSITIVE SARS-CoV-2 target nucleic acids are DETECTED. The SARS-CoV-2 RNA is generally detectable in upper and lower  respiratory specimens dur ing the acute phase of infection.  Positive  results are indicative of active infection with SARS-CoV-2.  Clinical  correlation with patient history and other diagnostic information is  necessary to determine patient infection status.  Positive results do  not rule out bacterial infection or co-infection with other viruses. If result is PRESUMPTIVE POSTIVE SARS-CoV-2 nucleic acids MAY BE PRESENT.   A presumptive positive result was obtained on the submitted specimen  and confirmed on repeat testing.  While 2019 novel coronavirus  (SARS-CoV-2) nucleic acids may be present in the submitted sample  additional confirmatory testing may be necessary for epidemiological  and / or clinical management purposes  to differentiate between  SARS-CoV-2 and other Sarbecovirus currently known to infect humans.  If clinically indicated additional testing with an alternate test  methodology [phone]) is advised. The SARS-CoV-2 RNA is generally  detectable in upper and lower respiratory sp ecimens during the acute  phase of infection. The expected result is Negative. Fact Sheet for Patients:  BoilerBrush.com.cy Fact Sheet for Healthcare Providers: https://pope.com/ This test is not yet approved or cleared by the [place] FDA and has been authorized for detection and/or diagnosis of SARS-CoV-2 by FDA under an Emergency Use Authorization (EUA).   This EUA will remain in effect (meaning this test can be used) for the duration of the COVID-19 declaration under Section 564(b)(1) of the Act, 21 U.S.C. section 360bbb-3(b)(1), unless the authorization is terminated or revoked sooner. Performed at [facility], [address] [place], [place] [address]   Urinalysis, Complete w Microscopic     Status: Abnormal   Collection Time: 12/24/18  8:26 AM  Result Value Ref Range   Color, Urine AMBER (A) YELLOW    Comment: BIOCHEMICALS MAY BE AFFECTED BY COLOR   APPearance CLEAR (A) CLEAR   Specific Gravity, Urine 1.027 1.005 - 1.030   pH 6.0 5.0 - 8.0   Glucose, UA NEGATIVE NEGATIVE mg/dL   Hgb urine dipstick LARGE (A) NEGATIVE   Bilirubin Urine NEGATIVE NEGATIVE   Ketones, ur 20 (A) NEGATIVE mg/dL   Protein, ur 478 (A) NEGATIVE mg/dL   Nitrite NEGATIVE NEGATIVE   Leukocytes,Ua NEGATIVE NEGATIVE   RBC / HPF 21-50 0 - 5 RBC/hpf   WBC, UA 0-5 0 - 5  WBC/hpf   Bacteria, UA NONE SEEN NONE SEEN   Squamous Epithelial / LPF 0-5 0 - 5   Mucus PRESENT     Comment: Performed at [facility], [address] [place], [place] [address]  Blood culture (routine x 2)     Status: None   Collection Time: 12/24/18  9:40 AM   Specimen: BLOOD  Result Value Ref Range   Specimen Description BLOOD LEFT AC    Special Requests      BOTTLES DRAWN AEROBIC AND ANAEROBIC Blood Culture adequate volume   Culture      NO GROWTH 5 DAYS Performed at [facility], [address] [place], [place] [address]    Report Status 12/29/2018 FINAL   Blood culture (routine x 2)     Status: None   Collection Time: 12/24/18  9:43 AM   Specimen: BLOOD  Result Value Ref Range   Specimen Description BLOOD RIGHT AC    Special Requests      BOTTLES DRAWN AEROBIC AND ANAEROBIC Blood Culture adequate volume   Culture      NO GROWTH 5 DAYS Performed at [facility], [address], [place], [place] [address]    Report Status 12/29/2018  FINAL   Lactic acid, plasma     Status: None   Collection Time: 12/24/18  9:50 AM  Result Value Ref Range   Lactic Acid, Venous 1.8 0.5 - 1.9 mmol/L    Comment: Performed at [facility], [address] [place], [place] [address]  Gastrointestinal Panel by PCR , Stool     Status: Abnormal   Collection Time: 12/24/18 12:58 PM   Specimen: Stool  Result Value Ref Range   Campylobacter species NOT DETECTED NOT DETECTED   Plesimonas shigelloides NOT DETECTED NOT DETECTED   Salmonella species DETECTED (A) NOT DETECTED    Comment: RESULT CALLED TO, READ BACK BY AND VERIFIED WITH: MOLLY WEISMILLER AT 1451 ON 12/24/2018 [facility].    Yersinia enterocolitica NOT DETECTED NOT DETECTED   Vibrio species NOT DETECTED NOT DETECTED   Vibrio cholerae NOT DETECTED NOT DETECTED   Enteroaggregative E coli (EAEC) NOT DETECTED NOT DETECTED   Enteropathogenic E coli (EPEC) NOT DETECTED NOT DETECTED   Enterotoxigenic E coli (ETEC) NOT DETECTED NOT DETECTED   Shiga like toxin producing E coli (STEC) NOT DETECTED NOT DETECTED   Shigella/Enteroinvasive E coli (EIEC) NOT DETECTED NOT DETECTED   Cryptosporidium NOT DETECTED NOT DETECTED   Cyclospora cayetanensis NOT DETECTED NOT DETECTED   Entamoeba histolytica NOT DETECTED NOT DETECTED   Giardia lamblia NOT DETECTED NOT DETECTED   Adenovirus F40/41 NOT DETECTED NOT DETECTED   Astrovirus NOT DETECTED NOT DETECTED   Norovirus GI/GII NOT DETECTED NOT DETECTED   Rotavirus A NOT DETECTED NOT DETECTED   Sapovirus (I, II, IV, and V) NOT DETECTED NOT DETECTED    Comment: Performed at [facility], [address] [place], [place] [address]  C difficile quick scan w PCR reflex     Status: None   Collection Time: 12/24/18 12:58 PM   Specimen: Stool  Result Value Ref Range   C Diff antigen NEGATIVE NEGATIVE   C Diff toxin NEGATIVE NEGATIVE   C Diff interpretation No C. difficile detected.     Comment: Performed at [facility], [address], [place], [place] [address]  Basic metabolic panel     Status: Abnormal   Collection Time: 12/24/18  5:57 PM  Result Value Ref Range  Sodium 136 135 - 145 mmol/L   Potassium 3.4 (L) 3.5 - 5.1 mmol/L   Chloride 105 98 - 111 mmol/L   CO2 22 22 - 32 mmol/L   Glucose, Bld 101 (H) 70 - 99 mg/dL   BUN 12 6 - 20 mg/dL   Creatinine, Ser 4.09 (H) 0.61 - 1.24 mg/dL   Calcium 7.7 (L) 8.9 - 10.3 mg/dL   GFR calc non Af Amer >60 >60 mL/min   GFR calc Af Amer >60 >60 mL/min   Anion gap 9 5 - 15    Comment: Performed at [facility], [address] [place], [place] [address]  Magnesium     Status: None   Collection Time: 12/24/18  5:57 PM  Result Value Ref Range   Magnesium 1.8 1.7 - 2.4 mg/dL    Comment: Performed at [facility], [address], [place], [place] [address]  Urine Drug Screen, Qualitative ([facility] only)     Status: None   Collection Time: 12/24/18  6:06 PM  Result Value Ref Range   Tricyclic, Ur Screen NONE DETECTED NONE DETECTED   Amphetamines, Ur Screen NONE DETECTED NONE DETECTED   MDMA (Ecstasy)Ur Screen NONE DETECTED NONE DETECTED   Cocaine Metabolite,Ur  Bend NONE DETECTED NONE DETECTED   Opiate, Ur Screen NONE DETECTED NONE DETECTED   Phencyclidine (PCP) Ur S NONE DETECTED NONE DETECTED   Cannabinoid 50 Ng, Ur [place] NONE DETECTED NONE DETECTED   Barbiturates, Ur Screen NONE DETECTED NONE DETECTED   Benzodiazepine, Ur Scrn NONE DETECTED NONE DETECTED   Methadone Scn, Ur NONE DETECTED NONE DETECTED    Comment: (NOTE) Tricyclics + metabolites, urine    Cutoff 1000 ng/mL Amphetamines + metabolites, urine  Cutoff 1000 ng/mL MDMA (Ecstasy), urine              Cutoff 500 ng/mL Cocaine Metabolite, urine          Cutoff 300 ng/mL Opiate + metabolites, urine        Cutoff 300 ng/mL Phencyclidine (PCP), urine         Cutoff 25 ng/mL Cannabinoid, urine                 Cutoff 50 ng/mL Barbiturates + metabolites, urine  Cutoff 200 ng/mL Benzodiazepine, urine               Cutoff 200 ng/mL Methadone, urine                   Cutoff 300 ng/mL The urine drug screen provides only a preliminary, unconfirmed analytical test result and should not be used for non-medical purposes. Clinical consideration and professional judgment should be applied to any positive drug screen result due to possible interfering substances. A more specific alternate chemical method must be used in order to obtain a confirmed analytical result. Gas chromatography / mass spectrometry (GC/MS) is the preferred confirmat ory method. Performed at [facility], [address] [place], [place] [address]   HIV Antibody (routine testing w rflx)     Status: None   Collection Time: 12/25/18  5:08 AM  Result Value Ref Range   HIV Screen 4th Generation wRfx Non Reactive Non Reactive    Comment: (NOTE) Performed At: [facility] [address] [place], [place] [id number] Jolene Schimke MD [id number]   Comprehensive metabolic panel     Status: Abnormal   Collection Time: 12/25/18  5:08 AM  Result Value Ref Range   Sodium 136 135 - 145  mmol/L   Potassium 3.1 (L) 3.5 - 5.1 mmol/L   Chloride 106 98 - 111 mmol/L   CO2 21 (L) 22 - 32 mmol/L   Glucose, Bld 105 (H) 70 - 99 mg/dL   BUN 11 6 - 20 mg/dL   Creatinine, Ser 1.61 0.61 - 1.24 mg/dL   Calcium 8.0 (L) 8.9 - 10.3 mg/dL   Total Protein 7.0 6.5 - 8.1 g/dL   Albumin 3.9 3.5 - 5.0 g/dL   AST 39 15 - 41 U/L   ALT 27 0 - 44 U/L   Alkaline Phosphatase 44 38 - 126 U/L   Total Bilirubin 1.6 (H) 0.3 - 1.2 mg/dL   GFR calc non Af Amer >60 >60 mL/min   GFR calc Af Amer >60 >60 mL/min   Anion gap 9 5 - 15    Comment: Performed at [facility], [address] [place], [place] [address]  CBC with Differential/Platelet     Status: Abnormal   Collection Time: 12/25/18  5:08 AM  Result Value Ref Range   WBC 10.2 4.0 - 10.5 K/uL   RBC 4.62 4.22 - 5.81 MIL/uL   Hemoglobin 15.8 13.0 - 17.0 g/dL   HCT 54.0 98.1  - 19.1 %   MCV 95.0 80.0 - 100.0 fL   MCH 34.2 (H) 26.0 - 34.0 pg   MCHC 36.0 30.0 - 36.0 g/dL   RDW 47.8 29.5 - 62.1 %   Platelets 101 (L) 150 - 400 K/uL    Comment: Immature Platelet Fraction may be clinically indicated, consider ordering this additional test [id number]    nRBC 0.0 0.0 - 0.2 %   Neutrophils Relative % 82 %   Neutro Abs 8.3 (H) 1.7 - 7.7 K/uL   Lymphocytes Relative 11 %   Lymphs Abs 1.1 0.7 - 4.0 K/uL   Monocytes Relative 6 %   Monocytes Absolute 0.6 0.1 - 1.0 K/uL   Eosinophils Relative 0 %   Eosinophils Absolute 0.0 0.0 - 0.5 K/uL   Basophils Relative 0 %   Basophils Absolute 0.0 0.0 - 0.1 K/uL   Immature Granulocytes 1 %   Abs Immature Granulocytes 0.08 (H) 0.00 - 0.07 K/uL    Comment: Performed at [facility], [address] [place], [place] [address]  Magnesium     Status: None   Collection Time: 12/25/18  5:08 AM  Result Value Ref Range   Magnesium 2.1 1.7 - 2.4 mg/dL    Comment: Performed at [facility], [address] [place], [place] [address]  CBC with Differential/Platelet     Status: Abnormal   Collection Time: 02/11/19 12:00 AM  Result Value Ref Range   WBC 10.9 (H) 3.8 - 10.8 Thousand/uL   RBC 4.81 4.20 - 5.80 Million/uL   Hemoglobin 17.0 13.2 - 17.1 g/dL   HCT 32.4 40.1 - 02.7 %   MCV 98.1 80.0 - 100.0 fL   MCH 35.3 (H) 27.0 - 33.0 pg   MCHC 36.0 32.0 - 36.0 g/dL   RDW 25.3 66.4 - 40.3 %   Platelets 170 140 - 400 Thousand/uL   MPV 12.3 7.5 - 12.5 fL   Neutro Abs 7,521 1,500 - 7,800 cells/uL   Lymphs Abs 2,453 850 - 3,900 cells/uL   Absolute Monocytes 796 200 - 950 cells/uL   Eosinophils Absolute 98 15 - 500 cells/uL   Basophils Absolute 33 0 - 200 cells/uL   Neutrophils Relative % 69 %   Total Lymphocyte 22.5 %  Monocytes Relative 7.3 %   Eosinophils Relative 0.9 %   Basophils Relative 0.3 %  COMPLETE METABOLIC PANEL WITH GFR     Status: Abnormal   Collection Time: 02/11/19 12:00 AM  Result Value Ref Range    Glucose, Bld 95 65 - 99 mg/dL    Comment: .            Fasting reference interval .    BUN 9 7 - 25 mg/dL   Creat 1.61 0.96 - 0.45 mg/dL   GFR, Est Non African American 74 > OR = 60 mL/min/1.51m2   GFR, Est African American 85 > OR = 60 mL/min/1.12m2   BUN/Creatinine Ratio NOT APPLICABLE 6 - 22 (calc)   Sodium 139 135 - 146 mmol/L   Potassium 3.8 3.5 - 5.3 mmol/L   Chloride 106 98 - 110 mmol/L   CO2 23 20 - 32 mmol/L   Calcium 9.9 8.6 - 10.3 mg/dL   Total Protein 7.0 6.1 - 8.1 g/dL   Albumin 4.6 3.6 - 5.1 g/dL   Globulin 2.4 1.9 - 3.7 g/dL (calc)   AG Ratio 1.9 1.0 - 2.5 (calc)   Total Bilirubin 1.3 (H) 0.2 - 1.2 mg/dL   Alkaline phosphatase (APISO) 78 36 - 130 U/L   AST 22 10 - 40 U/L   ALT 37 9 - 46 U/L  Lipid panel     Status: Abnormal   Collection Time: 02/11/19 12:00 AM  Result Value Ref Range   Cholesterol 165 <200 mg/dL   HDL 17 (L) > OR = 40 mg/dL   Triglycerides 409 (H) <150 mg/dL    Comment: . If a non-fasting specimen was collected, consider repeat triglyceride testing on a fasting specimen if clinically indicated.  Perry Mount et al. J. of Clin. Lipidol. 2015;9:129-169. Marland Kitchen    LDL Cholesterol (Calc) 107 (H) mg/dL (calc)    Comment: Reference range: <100 . Desirable range <100 mg/dL for primary prevention;   <70 mg/dL for patients with CHD or diabetic patients  with > or = 2 CHD risk factors. Marland Kitchen LDL-C is now calculated using the Martin-Hopkins  calculation, which is a validated novel method providing  better accuracy than the Friedewald equation in the  estimation of LDL-C.  Horald Pollen et al. Lenox Ahr. 8119;147(82): 2061-2068  (http://education.[facility].com/faq/FAQ164)    Total CHOL/HDL Ratio 9.7 (H) <5.0 (calc)   Non-HDL Cholesterol (Calc) 148 (H) <130 mg/dL (calc)    Comment: For patients with diabetes plus 1 major ASCVD risk  factor, treating to a non-HDL-C goal of <100 mg/dL  (LDL-C of <95 mg/dL) is considered a therapeutic  option.      Diabetic Foot Exam: Diabetic Foot Exam - Simple   No data filed      PHQ2/9: Depression screen [facility] 2/9 02/11/2019 08/11/2018 01/24/2018 09/27/2017 06/21/2017  Decreased Interest 0 0 0 0 0  Down, Depressed, Hopeless 0 0 0 0 0  PHQ - 2 Score 0 0 0 0 0  Altered sleeping 0 0 - - -  Tired, decreased energy 0 0 - - -  Change in appetite 0 0 - - -  Feeling bad or failure about yourself  0 0 - - -  Trouble concentrating 0 0 - - -  Moving slowly or fidgety/restless 0 0 - - -  Suicidal thoughts 0 0 - - -  PHQ-9 Score 0 0 - - -  Difficult doing work/chores Not difficult at all Not difficult at all - - -    Fall  Risk: Fall Risk  02/11/2019 08/11/2018 01/24/2018 09/27/2017 06/21/2017  Falls in the past year? 0 0 No No No  Number falls in past yr: 0 0 - - -  Injury with Fall? 0 0 - - -    Functional Status Survey: Is the patient deaf or have difficulty hearing?: No Does the patient have difficulty seeing, even when wearing glasses/contacts?: No Does the patient have difficulty concentrating, remembering, or making decisions?: No Does the patient have difficulty walking or climbing stairs?: No Does the patient have difficulty dressing or bathing?: No Does the patient have difficulty doing errands alone such as visiting a doctor's office or shopping?: No   Assessment & Plan:    CPE completed today  . Prostate cancer screening and PSA options (with potential risks and benefits of testing vs not testing) were discussed along with recent recs/guidelines, shared decision making and handout/information given to pt today  . USPSTF grade A and B recommendations reviewed with patient; age-appropriate recommendations, preventive care, screening tests, etc discussed and encouraged; healthy living encouraged; see AVS for patient education given to patient  . Discussed importance of 150 minutes of physical activity weekly, AHA exercise recommendations given to pt in AVS/handout  . Discussed importance of  healthy diet:  eating lean meats and proteins, avoiding trans fats and saturated fats, avoid simple sugars and excessive carbs in diet, eat 6 servings of fruit/vegetables daily and drink plenty of water and avoid sweet beverages.  DASH diet reviewed if pt has HTN  . Recommended pt to do annual eye exam and routine dental exams/cleanings  . Reviewed Health Maintenance: Health Maintenance  Topic Date Due  . INFLUENZA VACCINE  09/02/2019 (Originally 01/03/2019)  . TETANUS/TDAP  06/04/2025  . HIV Screening  Completed    . Immunizations: Immunization History  Administered Date(s) Administered  . HPV 9-valent 06/21/2017, 09/27/2017, 01/24/2018  . 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)    Recheck labs, no current tx and no dietary efforts      Tobacco use (Chronic)    Smoking cessation instruction/counseling given:  counseled patient on the dangers of tobacco use, advised patient to stop smoking, and reviewed strategies to maximize success  Pt will try again to quit or at least cut back, chantix started pack and then refills given, has tolerated in the past       Relevant Medications   varenicline (CHANTIX STARTING MONTH PAK) 0.5 MG X 11 & 1 MG X 42 tablet   varenicline (CHANTIX CONTINUING MONTH PAK) 1 MG tablet (Start on 03/13/2019)   Overweight (Chronic)    Diet and exercise counseling done today and pt given info in AVS      Total bilirubin, elevated    hx of mildly elevated bili, no other LFT's or sx, will recheck, possibly gilberts, pt has no jaundice He is not very interested in going to GI specialist, will monitor       Other Visit Diagnoses    General medical exam    -  Primary   Relevant Orders   CBC with Differential/Platelet (Completed)   COMPLETE METABOLIC PANEL WITH GFR (Completed)   Lipid panel (Completed)   Thrombocytopenia (HCC)       recheck with CBC   Relevant Orders   CBC with  Differential/Platelet (Completed)   Hypokalemia       recheck labs   Relevant Orders  COMPLETE METABOLIC PANEL WITH GFR (Completed)   AKI (acute kidney injury) (HCC)       recheck renal function   Relevant Orders   COMPLETE METABOLIC PANEL WITH GFR (Completed)   Familial hyperlipidemia       FLP, monitor, HDL has been very low in the past   Relevant Orders   Lipid panel (Completed)   Encounter for examination following treatment at hospital       hospital records, imaging, labs, discharge summary all reviewed, pt declined to f/up with GI         Danelle Berry, PA-C 02/12/19 4:19 PM  [facility]

## 2019-02-11 NOTE — Patient Instructions (Addendum)
Steps to Quit Smoking  Smoking tobacco is the leading cause of preventable death. It can affect almost every organ in the body. Smoking puts you and people around you at risk for many serious, long-lasting (chronic) diseases. Quitting smoking can be hard, but it is one of the best things that you can do for your health. It is never too late to quit. How do I get ready to quit? When you decide to quit smoking, make a plan to help you succeed. Before you quit:  Pick a date to quit. Set a date within the next 2 weeks to give you time to prepare.  Write down the reasons why you are quitting. Keep this list in places where you will see it often.  Tell your family, friends, and co-workers that you are quitting. Their support is important.  Talk with your doctor about the choices that may help you quit.  Find out if your health insurance will pay for these treatments.  Know the people, places, things, and activities that make you want to smoke (triggers). Avoid them. What first steps can I take to quit smoking?  Throw away all cigarettes at home, at work, and in your car.  Throw away the things that you use when you smoke, such as ashtrays and lighters.  Clean your car. Make sure to empty the ashtray.  Clean your home, including curtains and carpets. What can I do to help me quit smoking? Talk with your doctor about taking medicines and seeing a counselor at the same time. You are more likely to succeed when you do both.  If you are pregnant or breastfeeding, talk with your doctor about counseling or other ways to quit smoking. Do not take medicine to help you quit smoking unless your doctor tells you to do so. To quit smoking: Quit right away  Quit smoking totally, instead of slowly cutting back on how much you smoke over a period of time.  Go to counseling. You are more likely to quit if you go to counseling sessions regularly. Take medicine You may take medicines to help you quit.  Some medicines need a prescription, and some you can buy over-the-counter. Some medicines may contain a drug called nicotine to replace the nicotine in cigarettes. Medicines may:  Help you to stop having the desire to smoke (cravings).  Help to stop the problems that come when you stop smoking (withdrawal symptoms). Your doctor may ask you to use:  Nicotine patches, gum, or lozenges.  Nicotine inhalers or sprays.  Non-nicotine medicine that is taken by mouth. Find resources Find resources and other ways to help you quit smoking and remain smoke-free after you quit. These resources are most helpful when you use them often. They include:  Online chats with a Social worker.  Phone quitlines.  Printed Furniture conservator/restorer.  Support groups or group counseling.  Text messaging programs.  Mobile phone apps. Use apps on your mobile phone or tablet that can help you stick to your quit plan. There are many free apps for mobile phones and tablets as well as websites. Examples include Quit Guide from the [employer] and smokefree.gov  What things can I do to make it easier to quit?   Talk to your family and friends. Ask them to support and encourage you.  Call a phone quitline (1-800-QUIT-NOW), reach out to support groups, or work with a Social worker.  Ask people who smoke to not smoke around you.  Avoid places that make you want to  smoke, such as: ? Bars. ? Parties. ? Smoke-break areas at work.  Spend time with people who do not smoke.  Lower the stress in your life. Stress can make you want to smoke. Try these things to help your stress: ? Getting regular exercise. ? Doing deep-breathing exercises. ? Doing yoga. ? Meditating. ? Doing a body scan. To do this, close your eyes, focus on one area of your body at a time from head to toe. Notice which parts of your body are tense. Try to relax the muscles in those areas. How will I feel when I quit smoking? Day 1 to 3 weeks Within the first 24  hours, you may start to have some problems that come from quitting tobacco. These problems are very bad 2-3 days after you quit, but they do not often last for more than 2-3 weeks. You may get these symptoms:  Mood swings.  Feeling restless, nervous, angry, or annoyed.  Trouble concentrating.  Dizziness.  Strong desire for high-sugar foods and nicotine.  Weight gain.  Trouble pooping (constipation).  Feeling like you may vomit (nausea).  Coughing or a sore throat.  Changes in how the medicines that you take for other issues work in your body.  Depression.  Trouble sleeping (insomnia). Week 3 and afterward After the first 2-3 weeks of quitting, you may start to notice more positive results, such as:  Better sense of smell and taste.  Less coughing and sore throat.  Slower heart rate.  Lower blood pressure.  Clearer skin.  Better breathing.  Fewer sick days. Quitting smoking can be hard. Do not give up if you fail the first time. Some people need to try a few times before they succeed. Do your best to stick to your quit plan, and talk with your doctor if you have any questions or concerns. Summary  Smoking tobacco is the leading cause of preventable death. Quitting smoking can be hard, but it is one of the best things that you can do for your health.  When you decide to quit smoking, make a plan to help you succeed.  Quit smoking right away, not slowly over a period of time.  When you start quitting, seek help from your doctor, family, or friends. This information is not intended to replace advice given to you by your health care provider. Make sure you discuss any questions you have with your health care provider. Document Released: 03/17/2009 Document Revised: 08/08/2018 Document Reviewed: 08/09/2018 Elsevier Patient Education  2020 Douglas Greer 30-23 Years Old, Male  Preventive care refers to lifestyle choices and visits with your health  care provider that can promote health and wellness. This includes:  A yearly physical exam. This is also called an annual well check.  Regular dental and eye exams.  Immunizations.  Screening for certain conditions.  Healthy lifestyle choices, such as eating a healthy diet, getting regular exercise, not using drugs or products that contain nicotine and tobacco, and limiting alcohol use. What can I expect for my preventive care visit? Physical exam Your health care provider will check:  Height and weight. These may be used to calculate body mass index (BMI), which is a measurement that tells if you are at a healthy weight.  Heart rate and blood pressure.  Your skin for abnormal spots. Counseling Your health care provider may ask you questions about:  Alcohol, tobacco, and drug use.  Emotional well-being.  Home and relationship well-being.  Sexual activity.  Eating habits.  Work and work Statistician. What immunizations do I need?  Influenza (flu) vaccine  This is recommended every year. Tetanus, diphtheria, and pertussis (Tdap) vaccine  You may need a Td booster every 10 years. Varicella (chickenpox) vaccine  You may need this vaccine if you have not already been vaccinated. Human papillomavirus (HPV) vaccine  If recommended by your health care provider, you may need three doses over 6 months. Measles, mumps, and rubella (MMR) vaccine  You may need at least one dose of MMR. You may also need a second dose. Meningococcal conjugate (MenACWY) vaccine  One dose is recommended if you are 38-51 years old and a Market researcher living in a residence hall, or if you have one of several medical conditions. You may also need additional booster doses. Pneumococcal conjugate (PCV13) vaccine  You may need this if you have certain conditions and were not previously vaccinated. Pneumococcal polysaccharide (PPSV23) vaccine  You may need one or two doses if you smoke  cigarettes or if you have certain conditions. Hepatitis A vaccine  You may need this if you have certain conditions or if you travel or work in places where you may be exposed to hepatitis A. Hepatitis B vaccine  You may need this if you have certain conditions or if you travel or work in places where you may be exposed to hepatitis B. Haemophilus influenzae type b (Hib) vaccine  You may need this if you have certain risk factors. You may receive vaccines as individual doses or as more than one vaccine together in one shot (combination vaccines). Talk with your health care provider about the risks and benefits of combination vaccines. What tests do I need? Blood tests  Lipid and cholesterol levels. These may be checked every 5 years starting at age 11.  Hepatitis C test.  Hepatitis B test. Screening   Diabetes screening. This is done by checking your blood sugar (glucose) after you have not eaten for a while (fasting).  Sexually transmitted disease (STD) testing. Talk with your health care provider about your test results, treatment options, and if necessary, the need for more tests. Follow these instructions at home: Eating and drinking   Eat a diet that includes fresh fruits and vegetables, whole grains, lean protein, and low-fat dairy products.  Take vitamin and mineral supplements as recommended by your health care provider.  Do not drink alcohol if your health care provider tells you not to drink.  If you drink alcohol: ? Limit how much you have to 0-2 drinks a day. ? Be aware of how much alcohol is in your drink. In the [place] one drink equals one 12 oz bottle of beer (355 mL), one 5 oz glass of wine (148 mL), or one 1 oz glass of hard liquor (44 mL). Lifestyle  Take daily care of your teeth and gums.  Stay active. Exercise for at least 30 minutes on 5 or more days each week.  Do not use any products that contain nicotine or tobacco, such as cigarettes,  e-cigarettes, and chewing tobacco. If you need help quitting, ask your health care provider.  If you are sexually active, practice safe sex. Use a condom or other form of protection to prevent STIs (sexually transmitted infections). What's next?  Go to your health care provider once a year for a well check visit.  Ask your health care provider how often you should have your eyes and teeth checked.  Stay up to date on all  vaccines. This information is not intended to replace advice given to you by your health care provider. Make sure you discuss any questions you have with your health care provider. Document Released: 07/17/2001 Document Revised: 05/15/2018 Document Reviewed: 05/15/2018 Elsevier Patient Education  2020 [employer].

## 2019-02-12 ENCOUNTER — Encounter: Payer: Self-pay | Admitting: Family Medicine

## 2019-02-12 LAB — CBC WITH DIFFERENTIAL/PLATELET
Absolute Monocytes: 796 cells/uL (ref 200–950)
Basophils Absolute: 33 cells/uL (ref 0–200)
Basophils Relative: 0.3 %
Eosinophils Absolute: 98 cells/uL (ref 15–500)
Eosinophils Relative: 0.9 %
HCT: 47.2 % (ref 38.5–50.0)
Hemoglobin: 17 g/dL (ref 13.2–17.1)
Lymphs Abs: 2453 cells/uL (ref 850–3900)
MCH: 35.3 pg — ABNORMAL HIGH (ref 27.0–33.0)
MCHC: 36 g/dL (ref 32.0–36.0)
MCV: 98.1 fL (ref 80.0–100.0)
MPV: 12.3 fL (ref 7.5–12.5)
Monocytes Relative: 7.3 %
Neutro Abs: 7521 cells/uL (ref 1500–7800)
Neutrophils Relative %: 69 %
Platelets: 170 10*3/uL (ref 140–400)
RBC: 4.81 10*6/uL (ref 4.20–5.80)
RDW: 12.6 % (ref 11.0–15.0)
Total Lymphocyte: 22.5 %
WBC: 10.9 10*3/uL — ABNORMAL HIGH (ref 3.8–10.8)

## 2019-02-12 LAB — COMPLETE METABOLIC PANEL WITH GFR
AG Ratio: 1.9 (calc) (ref 1.0–2.5)
ALT: 37 U/L (ref 9–46)
AST: 22 U/L (ref 10–40)
Albumin: 4.6 g/dL (ref 3.6–5.1)
Alkaline phosphatase (APISO): 78 U/L (ref 36–130)
BUN: 9 mg/dL (ref 7–25)
CO2: 23 mmol/L (ref 20–32)
Calcium: 9.9 mg/dL (ref 8.6–10.3)
Chloride: 106 mmol/L (ref 98–110)
Creat: 1.3 mg/dL (ref 0.60–1.35)
GFR, Est African American: 85 mL/min/{1.73_m2} (ref >=60)
GFR, Est Non African American: 74 mL/min/{1.73_m2} (ref >=60)
Globulin: 2.4 g/dL (calc) (ref 1.9–3.7)
Glucose, Bld: 95 mg/dL (ref 65–99)
Potassium: 3.8 mmol/L (ref 3.5–5.3)
Sodium: 139 mmol/L (ref 135–146)
Total Bilirubin: 1.3 mg/dL — ABNORMAL HIGH (ref 0.2–1.2)
Total Protein: 7 g/dL (ref 6.1–8.1)

## 2019-02-12 LAB — LIPID PANEL
Cholesterol: 165 mg/dL (ref <200)
HDL: 17 mg/dL — ABNORMAL LOW (ref >=40)
LDL Cholesterol (Calc): 107 mg/dL (calc) — ABNORMAL HIGH
Non-HDL Cholesterol (Calc): 148 mg/dL (calc) — ABNORMAL HIGH (ref <130)
Total CHOL/HDL Ratio: 9.7 (calc) — ABNORMAL HIGH (ref <5.0)
Triglycerides: 304 mg/dL — ABNORMAL HIGH (ref <150)

## 2019-02-12 NOTE — Assessment & Plan Note (Signed)
Recheck labs, no current tx and no dietary efforts

## 2019-02-12 NOTE — Assessment & Plan Note (Signed)
hx of mildly elevated bili, no other LFT's or sx, will recheck, possibly gilberts, pt has no jaundice He is not very interested in going to GI specialist, will monitor

## 2019-02-12 NOTE — Assessment & Plan Note (Signed)
Diet and exercise counseling done today and pt given info in AVS

## 2019-02-12 NOTE — Assessment & Plan Note (Signed)
Smoking cessation instruction/counseling given:  counseled patient on the dangers of tobacco use, advised patient to stop smoking, and reviewed strategies to maximize success  Pt will try again to quit or at least cut back, chantix started pack and then refills given, has tolerated in the past

## 2019-02-28 ENCOUNTER — Encounter: Payer: Self-pay | Admitting: Family Medicine

## 2019-03-12 ENCOUNTER — Other Ambulatory Visit: Payer: Self-pay

## 2019-03-12 ENCOUNTER — Ambulatory Visit: Payer: Self-pay | Admitting: Physician Assistant

## 2019-03-12 ENCOUNTER — Encounter: Payer: Self-pay | Admitting: Physician Assistant

## 2019-03-12 DIAGNOSIS — Z113 Encounter for screening for infections with a predominantly sexual mode of transmission: Secondary | ICD-10-CM

## 2019-03-12 DIAGNOSIS — Z5321 Procedure and treatment not carried out due to patient leaving prior to being seen by health care provider: Secondary | ICD-10-CM

## 2019-03-12 DIAGNOSIS — A630 Anogenital (venereal) warts: Secondary | ICD-10-CM

## 2019-03-12 NOTE — Progress Notes (Signed)
Patient was scheduled for IS visit today.  Patient checked in late and was told he would have to wait.  At 11:48am, I went to the waiting room to call patient back.  Patient was not in the main, Nurse clinic or teen waiting rooms.  Left without being seen today.

## 2019-03-13 ENCOUNTER — Ambulatory Visit
Admission: EM | Admit: 2019-03-13 | Discharge: 2019-03-13 | Disposition: A | Discharge status: Home / Self Care | Payer: PRIVATE HEALTH INSURANCE | Attending: Urgent Care | Admitting: Urgent Care

## 2019-03-13 ENCOUNTER — Other Ambulatory Visit: Payer: Self-pay

## 2019-03-13 DIAGNOSIS — Z113 Encounter for screening for infections with a predominantly sexual mode of transmission: Secondary | ICD-10-CM

## 2019-03-13 DIAGNOSIS — Z202 Contact with and (suspected) exposure to infections with a predominantly sexual mode of transmission: Secondary | ICD-10-CM | POA: Diagnosis not present

## 2019-03-13 LAB — HEPATITIS PANEL, ACUTE
HCV Ab: NONREACTIVE
Hep A IgM: NONREACTIVE
Hep B C IgM: NONREACTIVE
Hepatitis B Surface Ag: NONREACTIVE

## 2019-03-13 LAB — HIV ANTIBODY (ROUTINE TESTING W REFLEX): HIV Screen 4th Generation wRfx: NONREACTIVE

## 2019-03-13 NOTE — Discharge Instructions (Addendum)
It was very nice seeing you today in clinic. Thank you for entrusting me with your care.   STD testing performed today in clinic (HIV, hepatitis, gonorrhea, chlamydia, and syphilis) per your request for screening labs. You indicated that you were not having any symptoms; no indication for prophylactic treatment at this time. Results can take up to a week to come back. If anything is positive, you will be contacted with directives regarding need for treatment.   If you develop any symptoms or concerns, please seek follow up care either here or in the ER. Please remember, our [place] providers are "right here with you" when you need [place].   Again, it was my pleasure to take care of you today. Thank you for choosing our clinic. I hope that you start to feel better quickly.   Honor Loh, MSN, APRN, FNP-C, CEN Advanced Practice Provider [facility]

## 2019-03-13 NOTE — ED Provider Notes (Signed)
Douglas Greer   Name: Douglas Greer DOB: Oct 05, 1990 MRN: [id number] CSN: [id number] PCP: Delsa Grana, PA-C  Arrival date and time:  03/13/19 1255  Chief Complaint:  Exposure to STD   NOTE: Prior to seeing the patient today, I have reviewed the triage nursing documentation and vital signs. Clinical staff has updated patient's PMH/PSHx, current medication list, and drug allergies/intolerances to ensure comprehensive history available to assist in medical decision making.   History:   HPI: Douglas Greer is a 27 y.o. male who presents today with requests for screening for "every STD you can test for". Patient denies any known exposures. He denies urinary symptoms; no dysuria, frequency, or urgency. He has not appreciated any gross hematuria, nor has he noticed his urine being malodorous. Patient denies any associated nausea, vomiting, fever, and chills. He has not experienced any pain in his lower back or flank area. Patient advises that he does not have a past medical history that is significant for recurrent urinary tract infections, STIs, or urolithiasis. Patient endorses the fact that he engages in unprotected sexual activity. He denies pain in his penis, testicles, or scrotum; no swelling. No penile bleeding or discharge. He presents today feeling generally well. Patient states, "I just want to be tested to be sure everything is alright".   Past Medical History:  Diagnosis Date   Colitis presumed infectious 12/24/2018   Elevated hemoglobin (HCC) 06/21/2017   High hematocrit 06/21/2017   Low HDL (under 40) 11/26/2015   Neck arthritis 09/27/2017   Tobacco use 11/27/2015   Verruca plana 11/25/2015    Past Surgical History:  Procedure Laterality Date   INCISIONAL HERNIA REPAIR     NASAL FRACTURE SURGERY      Family History  Problem Relation Age of Onset   Hypertension Mother    Diabetes Father    Asthma Son    Allergies Son        peanuts    Social History     Tobacco Use   Smoking status: Current Every Day Smoker    Packs/day: 1.00    Years: 12.00    Pack years: 12.00    Types: Cigarettes   Smokeless tobacco: Never Used  Substance Use Topics   Alcohol use: Yes    Alcohol/week: 0.0 standard drinks    Comment: occassional   Drug use: Yes    Types: Marijuana    Patient Active Problem List   Diagnosis Date Noted   Elevated serum glutamic pyruvic transaminase (SGPT) level 08/12/2018   Spondylosis without myelopathy or radiculopathy, cervical region 04/10/2018   Overweight 01/24/2018   Genital warts 10/04/2016   Tobacco use 11/27/2015   Total bilirubin, elevated 11/26/2015   Low HDL (under 40) 11/26/2015    Home Medications:    No outpatient medications have been marked as taking for the 03/13/19 encounter [facility] Encounter).    Allergies:   Amoxil [amoxicillin]  Review of Systems (ROS): Review of Systems  Constitutional: Negative for chills and fever.  Respiratory: Negative for cough and shortness of breath.   Cardiovascular: Negative for chest pain and palpitations.  Gastrointestinal: Negative for abdominal pain, diarrhea, nausea and vomiting.  Genitourinary: Negative for difficulty urinating, discharge, dysuria, enuresis, flank pain, frequency, genital sores, hematuria, penile pain, penile swelling, scrotal swelling, testicular pain and urgency.  Musculoskeletal: Negative for back pain.  All other systems reviewed and are negative.    Vital Signs: Today's Vitals   03/13/19 1310 03/13/19 1311  BP:  125/76  Pulse:  77  Resp:  16  Temp:  98.2 F (36.8 C)  TempSrc:  Oral  SpO2:  98%  Weight: 190 lb (86.2 kg)   Height: 5\' 8"  (1.727 m)   PainSc: 0-No pain     Physical Exam: Physical Exam  Constitutional: He is oriented to person, place, and time and well-developed, well-nourished, and in no distress.  HENT:  Head: Normocephalic and atraumatic.  Mouth/Throat: Mucous membranes are normal.  Eyes:  Pupils are equal, round, and reactive to light.  Neck: Normal range of motion. Neck supple.  Cardiovascular: Normal rate, regular rhythm, normal heart sounds and intact distal pulses. Exam reveals no gallop and no friction rub.  No murmur heard. Pulmonary/Chest: Effort normal and breath sounds normal. No respiratory distress. He has no wheezes. He has no rales.  Abdominal: Soft. He exhibits no distension. There is no abdominal tenderness.  Genitourinary:    Genitourinary Comments: Exam deferred. No symptoms. Urine and blood collected for STI testing.   Neurological: He is alert and oriented to person, place, and time. Gait normal.  Skin: Skin is warm and dry. No rash noted.  Psychiatric: Mood, memory, affect and judgment normal.  Nursing note and vitals reviewed.   Urgent Care Treatments / Results:   LABS: PLEASE NOTE: all labs that were ordered this encounter are listed, however only abnormal results are displayed. Labs Reviewed  GC/CHLAMYDIA PROBE AMP  RPR  HIV ANTIBODY (ROUTINE TESTING W REFLEX)  HEPATITIS PANEL, ACUTE  HIV4GL SAVE TUBE    EKG: -None  RADIOLOGY: No results found.  PROCEDURES: Procedures  MEDICATIONS RECEIVED THIS VISIT: Medications - No data to display  PERTINENT CLINICAL COURSE NOTES/UPDATES:   Initial Impression / Assessment and Plan / Urgent Care Course:  Pertinent labs & imaging results that were available during my care of the patient were personally reviewed by me and considered in my medical decision making (see lab/imaging section of note for values and interpretations).  [name] is a 28 y.o. male who presents to [facility] today with complaints of request for STI testing.   Patient is well appearing overall in clinic today. He does not appear to be in any acute distress. Presenting symptoms (see HPI) and exam as documented above. No symptoms. Requesting for testing only. Urine and blood sent for GC, RPR, HIV, and hepatitis  today. Patient to be contacted with the results and need for treatment should anything return positive. He is aware that results can take several days (up to 5) to result.   Discussed follow up with primary care physician for re-evaluation should he develop any symptoms. I have reviewed the follow up and strict return precautions for any new or worsening symptoms. Patient is aware of symptoms that would be deemed urgent/emergent, and would thus require further evaluation either here or in the emergency department. At the time of discharge, he verbalized understanding and consent with the discharge plan as it was reviewed with him. All questions were fielded by provider and/or clinic staff prior to patient discharge.    Final Clinical Impressions / Urgent Care Diagnoses:   Final diagnoses:  Screen for STD (sexually transmitted disease)  Possible exposure to STD    New Prescriptions:  [place] Controlled Substance Registry consulted? Not Applicable  No orders of the defined types were placed in this encounter.   Recommended Follow up Care:  Patient encouraged to follow up with the following provider within the specified time frame, or sooner as dictated by the severity of his symptoms.  As always, he was instructed that for any urgent/emergent care needs, he should seek care either here or in the emergency department for more immediate evaluation.  Follow-up Information    Danelle Berryapia, Leisa, PA-C.   Specialty: Family Medicine Why: As needed Contact information: [address] [place] [address] [phone]         NOTE: This note was prepared using Dragon dictation software along with smaller phrase technology. Despite my best ability to proofread, there is the potential that transcriptional errors may still occur from this process, and are completely unintentional.     Verlee MonteGray, Rashawn Rayman E, NP 03/14/19 1136

## 2019-03-14 LAB — RPR: RPR Ser Ql: NONREACTIVE

## 2019-03-16 LAB — GC/CHLAMYDIA PROBE AMP
Chlamydia trachomatis, NAA: NEGATIVE
Neisseria Gonorrhoeae by PCR: NEGATIVE

## 2019-07-09 NOTE — Progress Notes (Deleted)
Patient ID: Douglas Greer, male    DOB: 1991-01-19, 29 y.o.   MRN: [id number]  PCP: Jamelle Haring, MD  No chief complaint on file.   Subjective:   Douglas Greer is a 29 y.o. male, presents to clinic with CC of the following:  No chief complaint on file.   HPI:  Last visit at [facility] - 02/12/2019 with Danelle Berry for annual physical after was hospitalized in July 2020 for sepsis secondary to gastroenteritis, note was reviewed.  His lipid panel was abnormal at that time, with high triglycerides and a low HDL and diet modifications and increased exercise recommended with a follow-up in 4 to 76-month timeframe recommended.  His comprehensive panel was good, with the exception of a slightly high total bilirubin that was similar to prior readings and a h/o Gilbert's disease had been entertained previously.    Chantix was recommended to help quit previously (during the 02/2019 visit) Alcohol use- Had 3 doses of the HPV vaccine in 2019. + h/o genital warts noted in 2018  Patient was seen 03/10/2019 at Urgent Care for white coating on the top of the tongue and diagnosed with mild leukoplakia.  No specific treatment recommendations given. At that time he denied any other lesions in mouth or oropharyngeal swelling or pain. Also denied any SOB or difficulty breathing, denied sore throat or difficulty swallowing. Denied a rash. Denied any GU symptoms or any concern for STDs (with fairly recent negative STD testing including HIV noted)  On 03/13/2019, he was seen in urgent care wanting screen for STDs again.  He denied any concerning exposure.  Testing was done including GC/chlamydia negative, RPR nonreactive, HIV (fourth-generation) was nonreactive, hepatitis panel was negative including a negative HCV antibody.    Patient Active Problem List   Diagnosis Date Noted  . 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 Spondylosis without myelopathy or  radiculopathy, cervical region 04/10/2018  . Overweight 01/24/2018  . Genital warts 10/04/2016  . Tobacco use 11/27/2015  . Total bilirubin, elevated 11/26/2015  . Low HDL (under 40) 11/26/2015      Current Outpatient Medications:  .  ciprofloxacin (CIPRO) 500 MG tablet, Take 1 tablet (500 mg total) by mouth 2 (two) times daily. (Patient not taking: Reported on 02/11/2019), Disp: 14 tablet, Rfl: 0 .  Melatonin 5 MG TABS, Take 5 mg by mouth at bedtime as needed (sleep)., Disp: , Rfl:  .  varenicline (CHANTIX CONTINUING MONTH PAK) 1 MG tablet, Take 1 tablet (1 mg total) by mouth 2 (two) times daily., Disp: 60 tablet, Rfl: 4 .  varenicline (CHANTIX STARTING MONTH PAK) 0.5 MG X 11 & 1 MG X 42 tablet, Take 0.5 mg tablet by mouth 1x daily for 3 days, then increase to 0.5 mg tablet 2x daily for 4 days, then increase to 1 mg tablet 2x daily., Disp: 53 tablet, Rfl: 0   Allergies  Allergen Reactions  . Amoxil [Amoxicillin] Rash     Past Surgical History:  Procedure Laterality Date  . INCISIONAL HERNIA REPAIR    . NASAL FRACTURE SURGERY       Family History  Problem Relation Age of Onset  . Hypertension Mother   . Diabetes Father   . Asthma Son   . Allergies Son        peanuts     Social History   Socioeconomic History  . Marital status: Single    Spouse name: Not on file  . Number of children:  2  . Years of education: 2  . Highest education level: Bachelor's degree (e.g., BA, AB, BS)  Occupational History  . Occupation: IT  Tobacco Use  . Smoking status: Current Every Day Smoker    Packs/day: 1.00    Years: 12.00    Pack years: 12.00    Types: Cigarettes  . Smokeless tobacco: Never Used  Substance and Sexual Activity  . Alcohol use: Yes    Alcohol/week: 0.0 standard drinks    Comment: occassional  . Drug use: Yes    Types: Marijuana  . Sexual activity: Yes  Other Topics Concern  . Not on file  Social History Narrative  . Not on file   Social Determinants of  Health   Financial Resource Strain: Low Risk   . Difficulty of Paying Living Expenses: Not hard at all  Food Insecurity: No Food Insecurity  . Worried About Programme researcher, broadcasting/film/video in the Last Year: Never true  . Ran Out of Food in the Last Year: Never true  Transportation Needs: No Transportation Needs  . Lack of Transportation (Medical): No  . Lack of Transportation (Non-Medical): No  Physical Activity: Inactive  . Days of Exercise per Week: 0 days  . Minutes of Exercise per Session: 0 min  Stress: No Stress Concern Present  . Feeling of Stress : Not at all  Social Connections: Unknown  . Frequency of Communication with Friends and Family: Three times a week  . Frequency of Social Gatherings with Friends and Family: Three times a week  . Attends Religious Services: Never  . Active Member of Clubs or Organizations: No  . Attends Banker Meetings: Never  . Marital Status: Not on file  Intimate Partner Violence: Not At Risk  . Fear of Current or Ex-Partner: No  . Emotionally Abused: No  . Physically Abused: No  . Sexually Abused: No     No results found for this or any previous visit (from the past 72 hour(s)).   PHQ2/9: Depression screen [facility] 2/9 02/11/2019 08/11/2018 01/24/2018 09/27/2017 06/21/2017  Decreased Interest 0 0 0 0 0  Down, Depressed, Hopeless 0 0 0 0 0  PHQ - 2 Score 0 0 0 0 0  Altered sleeping 0 0 - - -  Tired, decreased energy 0 0 - - -  Change in appetite 0 0 - - -  Feeling bad or failure about yourself  0 0 - - -  Trouble concentrating 0 0 - - -  Moving slowly or fidgety/restless 0 0 - - -  Suicidal thoughts 0 0 - - -  PHQ-9 Score 0 0 - - -  Difficult doing work/chores Not difficult at all Not difficult at all - - -   PHQ-2/9 Result is   Fall Risk: Fall Risk  02/11/2019 08/11/2018 01/24/2018 09/27/2017 06/21/2017  Falls in the past year? 0 0 No No No  Number falls in past yr: 0 0 - - -  Injury with Fall? 0 0 - - -      Objective:   There were no  vitals filed for this visit.  There is no height or weight on file to calculate BMI.  Physical Exam      Results for orders placed or performed during the hospital encounter of 03/13/19  GC/Chlamydia Probe Amp   Specimen: Urine  Result Value Ref Range   Chlamydia trachomatis, NAA Negative Negative   Neisseria Gonorrhoeae by PCR Negative Negative   CT/NG NAA Source URINE  RPR  Result Value Ref Range   RPR Ser Ql NON REACTIVE NON REACTIVE  HIV Antibody (routine testing w rflx)  Result Value Ref Range   HIV Screen 4th Generation wRfx NON REACTIVE NON REACTIVE  Hepatitis panel, acute  Result Value Ref Range   Hepatitis B Surface Ag NON REACTIVE NON REACTIVE   HCV Ab NON REACTIVE NON REACTIVE   Hep A IgM NON REACTIVE NON REACTIVE   Hep B C IgM NON REACTIVE NON REACTIVE       Assessment & Plan:    No show for appointment      Towanda Malkin, MD 07/09/19 10:22 AM

## 2019-07-10 ENCOUNTER — Ambulatory Visit: Payer: PRIVATE HEALTH INSURANCE | Admitting: Internal Medicine

## 2019-08-11 ENCOUNTER — Ambulatory Visit: Payer: PRIVATE HEALTH INSURANCE | Admitting: Internal Medicine

## 2019-08-24 NOTE — Progress Notes (Signed)
Patient ID: Douglas Greer, male    DOB: 10-Sep-1990, 29 y.o.   MRN: [id number]  PCP: Jamelle Haring, MD  Chief Complaint  Patient presents with  . Hospitalization Follow-up    collapsed left lung    Subjective:   Douglas Greer is a 29 y.o. male, presents to clinic with CC of the following:  Chief Complaint  Patient presents with  . Hospitalization Follow-up    collapsed left lung    HPI:  Patient is a 29 y.o. male who was last seen by Danelle Berry in September 2020 for annual PE. He follows up today after he was seen in the [facility] ER 08/20/2019 with chest pain and small spont pneumothorax noted: The patient is an otherwise healthy 29 year old male smoker who presents to the emergency department with 24 hours of left-sided chest pain described as a twinging sensation in his left chest. It came on suddenly while at rest. Concern for possible spontaneous pneumothorax versus PE as the patient arrives tachycardic and is PERC positive. Patient's initial EKG was without significant ischemic changes. His initial chest x-ray was concerning for a small apical pneumothorax. I spoke with the cardiothoracic surgery fellow on-call who recommended observation in the ED for 4 hours with a repeat chest x-ray. If the patient's pneumothorax expands, recommended chest tube placement and admission. If the patient's pneumothorax is stable on chest x-ray at 4 hours, recommended discharge home with conservative management. Given the patient's pneumothorax in the vicinity of his chest discomfort, favor this is to be the likely cause of his symptoms. Low suspicion for PE or ACS at this time.  -Repeat chest x-ray showed stable ptx without increase in size, no focal consolidations, no mediastinal widening, no cardiomegaly, no other acute cardiopulmonary process. -He was reassessed and in stable condition with no acute complaints. He stated he does not have pain while laying at rest. He was not  short of breath, tachypneic, or requiring oxygen. He was felt appropriate for discharge. I counseled him on his diagnosis and educated him on return precautions including any worsening shortness of breath or chest pain. Plan was to follow-up with his primary care doctor in a few days.  Patient is a current smoker, 1 PPD for 12 years, noted was willing to try and quit last visit in Sept 2020 and planned to try chantix again (had in the past), insurance would not cover.  Tried the patches so far and will keep trying.  Noted continued to smoke on very recent ER visit noted above.   Since discharge from the ER, notes is "getting better", less painful, still a little uncomfortable when takes a deep breath, not at rest, not with standing. No palpitations. Is worse when first wakes up in am. Better when more active during the day.  Sleeping ok.  HR usually stays in the 70's.  Taking no meds presently, has tylenol to take as needed. No swelling in legs, no fevers, no pains down the arm or up to the jaw, no N/V, marked sweatiness.    Prior PMH: In October 2020, he had a complete STD screen including HIV and an acute Hep panel and all negative/nonreactive (asx'ic, went to urgent care to be tested0.  He was hospitalized 12/24/2018 for sepsis secondary to gastroenteritis. CT scan showed inflamed colon (both large and small) patient also had electrolyte derangements and mild AKI.  He was encouraged to see gastroenterologist after discharge but did not f/u.  He notes his appetite,  bowel movements and his weight have remained better. Not on any specific diet. He denies any abdominal pain, N/V, constipation, diarrhea, hematochezia, melena.   He does have a history of elevated bilirubin and prior PCP had previously suspected Gilbert's disease and also referred him to GI. Last labs in Sept still had a mildly elevated T. Bili and all other LFT's ok.  Overweight Weight increasing. Not watch diet, works night  shift. Wt Readings from Last 3 Encounters:  08/25/19 200 lb (90.7 kg)  03/13/19 190 lb (86.2 kg)  02/11/19 187 lb (84.8 kg)   Hyperlipidemia Patient had an abnormal lipid panel last visit. + FH - on Dad's side, Dad on meds for this Lab Results  Component Value Date   CHOL 165 02/11/2019   HDL 17 (L) 02/11/2019   LDLCALC 107 (H) 02/11/2019   TRIG 304 (H) 02/11/2019   CHOLHDL 9.7 (H) 02/11/2019   Works at a help desk, mostly sitting Alcohol - occas No regular exercise, occas walks and notes when breathing increases, hurts more  Patient Active Problem List   Diagnosis Date Noted  . 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 Spondylosis without myelopathy or radiculopathy, cervical region 04/10/2018  . Overweight 01/24/2018  . Genital warts 10/04/2016  . Tobacco use 11/27/2015  . Total bilirubin, elevated 11/26/2015  . Low HDL (under 40) 11/26/2015      Current Outpatient Medications:  Marland Kitchen  Melatonin 5 MG TABS, Take 5 mg by mouth at bedtime as needed (sleep)., Disp: , Rfl:  .  multivitamin (ONE-A-DAY MEN'S) TABS tablet, Take 1 tablet by mouth daily., Disp: , Rfl:  .  ciprofloxacin (CIPRO) 500 MG tablet, Take 1 tablet (500 mg total) by mouth 2 (two) times daily. (Patient not taking: Reported on 02/11/2019), Disp: 14 tablet, Rfl: 0   Allergies  Allergen Reactions  . Amoxil [Amoxicillin] Rash     Past Surgical History:  Procedure Laterality Date  . INCISIONAL HERNIA REPAIR    . NASAL FRACTURE SURGERY       Family History  Problem Relation Age of Onset  . Hypertension Mother   . Diabetes Father   . Asthma Son   . Allergies Son        peanuts     Social History   Tobacco Use  . Smoking status: Current Every Day Smoker    Packs/day: 1.00    Years: 12.00    Pack years: 12.00    Types: Cigarettes  . Smokeless tobacco: Never Used  Substance Use Topics  . Alcohol use: Yes    Alcohol/week: 0.0 standard drinks    Comment: occassional     With staff assistance, above reviewed with the patient today.  ROS: As per HPI, otherwise no specific complaints on a limited and focused system review   No results found for this or any previous visit (from the past 72 hour(s)).   PHQ2/9: Depression screen [facility] 2/9 08/25/2019 02/11/2019 08/11/2018 01/24/2018 09/27/2017  Decreased Interest 0 0 0 0 0  Down, Depressed, Hopeless 0 0 0 0 0  PHQ - 2 Score 0 0 0 0 0  Altered sleeping 0 0 0 - -  Tired, decreased energy 0 0 0 - -  Change in appetite 0 0 0 - -  Feeling bad or failure about yourself  0 0 0 - -  Trouble concentrating 0 0 0 - -  Moving slowly or fidgety/restless 0 0 0 - -  Suicidal thoughts 0 0 0 - -  PHQ-9 Score 0 0 0 - -  Difficult doing work/chores Not difficult at all Not difficult at all Not difficult at all - -   PHQ-2/9 Result is neg  Fall Risk: Fall Risk  08/25/2019 02/11/2019 08/11/2018 01/24/2018 09/27/2017  Falls in the past year? 0 0 0 No No  Number falls in past yr: 0 0 0 - -  Injury with Fall? 0 0 0 - -      Objective:   Vitals:   08/25/19 0843  BP: 112/74  Pulse: (!) 104  Resp: 16  Temp: (!) 97.5 F (36.4 C)  TempSrc: Temporal  SpO2: 94%  Weight: 200 lb (90.7 kg)  Height: 5\' 8"  (1.727 m)    Body mass index is 30.41 kg/m.  Physical Exam   NAD, masked, pleasant HEENT - [place]/AT, sclera anicteric, PERRL, EOMI, conj - non-inj'ed,  pharynx clear Neck - supple, carotids 2+ and = without bruits bilat Car - RRR without m/g/r, HR approx 90 on my exam ,not tachy Pulm- RR and effort normal at rest, CTA without wheeze or rales, could hear breath sounds in the upper lung fields, including upper left lung fields posterior Abd - soft, NT diffusely Ext - no LE edema, Neuro/psychiatric - affect was not flat, appropriate with conversation  Alert and oriented  Speech  normal       Assessment & Plan:  1. Spontaneous pneumothorax Patient has remained stable, and improved some since discharge from the ER on  08/20/2019. Educated, and noted how they often do reexpand without a procedure needed.  That is the goal. Do feel rechecking a chest x-ray will be helpful presently.  Ordered Await that result. Also continuing with activity modifications to some extent, and waiting till symptoms have completely resolved before any significant increasing of physical activity levels.  Did discuss over time the goal to get more physically active with some regular exercise, although not significantly increased in the short-term here noted. - DG Chest 2 View; Future  2. Tobacco dependence Again strongly encouraged complete tobacco cessation, and he noted he is working towards that. We will continue the nicotine supplements, and if not having success, will again try to get the Chantix to help Recommended trying just to reduce a cigarette or 2 every few days as a good way to try to help as well in the short-term.  Follow-up again in 2 weeks time, sooner as needed. Again emphasized if he has any more acute chest pains, shortness of breath, near passing out feelings, he needs to present to the emergency room immediately, and he was understanding of that.  3. Overweight Did discuss this briefly with him today.  Weight management over time discussed, and will continue to monitor.     Towanda Malkin, MD 08/25/19 8:54 AM

## 2019-08-25 ENCOUNTER — Other Ambulatory Visit: Payer: Self-pay

## 2019-08-25 ENCOUNTER — Ambulatory Visit
Admission: RE | Admit: 2019-08-25 | Discharge: 2019-08-25 | Disposition: A | Discharge status: Home / Self Care | Payer: PRIVATE HEALTH INSURANCE | Source: Ambulatory Visit | Attending: Internal Medicine | Admitting: Internal Medicine

## 2019-08-25 ENCOUNTER — Encounter: Payer: Self-pay | Admitting: Internal Medicine

## 2019-08-25 ENCOUNTER — Ambulatory Visit: Payer: PRIVATE HEALTH INSURANCE | Admitting: Internal Medicine

## 2019-08-25 VITALS — BP 112/74 | HR 104 | Temp 97.5°F | Resp 16 | Ht 68.0 in | Wt 200.0 lb

## 2019-08-25 DIAGNOSIS — J9383 Other pneumothorax: Secondary | ICD-10-CM | POA: Diagnosis not present

## 2019-08-25 DIAGNOSIS — E663 Overweight: Secondary | ICD-10-CM | POA: Diagnosis not present

## 2019-08-25 DIAGNOSIS — F17200 Nicotine dependence, unspecified, uncomplicated: Secondary | ICD-10-CM | POA: Diagnosis not present

## 2019-08-25 NOTE — Patient Instructions (Signed)
Please obtain x-ray today.

## 2019-08-26 ENCOUNTER — Encounter: Payer: Self-pay | Admitting: Internal Medicine

## 2019-09-02 NOTE — Progress Notes (Signed)
Patient ID: Douglas Greer, male    DOB: 06-Mar-1991, 29 y.o.   MRN: [id number]  PCP: Jamelle Haring, MD  Chief Complaint  Patient presents with  . Follow-up    f/u on spont pneumothorax, he feels after a deep sleep he wakes up in pain , chest very heavy  . Hyperlipidemia    Subjective:   Douglas Greer is a 29 y.o. male, presents to clinic with CC of the following:  Chief Complaint  Patient presents with  . Follow-up    f/u on spont pneumothorax, he feels after a deep sleep he wakes up in pain , chest very heavy  . Hyperlipidemia    HPI:  Patient is a 29 year old male who follows up today after a saw 08/25/2019 for follow-up after being seen in the emergency department for a spontaneous pneumothorax with that note reviewed. He had been seen by Danelle Berry in September 2020 for annual PE.   He was seen in the [facility] ER 08/20/2019 with chest pain and small spont pneumothorax noted: We did repeat an x-ray on our last visit with the following results: FINDINGS: The heart size and mediastinal contours are within normal limits. No pneumothorax or pleural effusion is noted. Right lung is clear. Minimal left lingular subsegmental atelectasis is noted. The visualized skeletal structures are unremarkable.  IMPRESSION: Minimal left lingular subsegmental atelectasis.  Electronically Signed   By: Lupita Raider M.D.   On: 08/25/2019 11:52  Since our last visit,  notes he still is having intermittent left-sided chest pains, the worst was Friday a week ago, after he awoke from a deep sleep and had sharp pains that were 8-9 out of 10.  This was like the pain he had prior to his ER visit.  They did settle down, and over this past week, he has noted the pains come and go.  He states when he is more active, he gets more out of breath.  He has not increased any regular exercise.  Denies any pains that go down to the arm or up to the jaw, no nausea/vomiting , occasionally  his heart will go faster, although not persistent.  No recent fevers. He works at a Holiday representative at [employer], worked last night, and noted minimal pain.  He plans to work throughout the weekend. Taking no meds presently, has tylenol to take as needed Patient is a current smoker,1 PPD for 12 years,  and noted trying to quit here very recently has not been successful.  He planned to try chantix again (had in the past),insurance would not cover.  Wanted him to try nicotine supplements. Tried the patches so far and and has not been helpful in trying to quit. Strongly encouraged continued efforts and the importance of that.  Also I was all for trying Chantix again if he felt that would be helpful   Prior PMH: In October 2020, he had a complete STD screen including HIV and an acute Hep panel and all negative/nonreactive (asx'ic, went to urgent care to be tested).  He was hospitalized 12/24/2018 for sepsis secondary to gastroenteritis. CT scan showed inflamed colon (both large and small) patient also had electrolyte derangements and mild AKI.He was encouraged to see gastroenterologist after discharge but did not f/u.  He notes his appetite, bowel movements and his weight have remained better. Not on any specific diet.He denies any abdominal pain, N/V, constipation, diarrhea, hematochezia, melena.  He does have a history of elevated bilirubin  and prior PCP had previously suspected Gilbert's disease and also referred him to GI. Last labs in Sept still had a mildly elevated T. Bili and all other LFT's ok.  Overweight Weight increasing from October 2020, no increase from last visit, works night shift. Wt Readings from Last 3 Encounters:  09/04/19 199 lb 12.8 oz (90.6 kg)  08/25/19 200 lb (90.7 kg)  03/13/19 190 lb (86.2 kg)   Hyperlipidemia Patient had an abnormal lipid panel last visit. + FH - on Dad's side, Dad on meds for this Lab Results  Component Value Date   CHOL 165  02/11/2019   HDL 17 (L) 02/11/2019   LDLCALC 107 (H) 02/11/2019   TRIG 304 (H) 02/11/2019   CHOLHDL 9.7 (H) 02/11/2019   Works at a help desk, mostly sitting Alcohol - occas No regular exercise, occas walks and notes when breathing increases, feels more out of breath,  Patient Active Problem List   Diagnosis Date Noted  . Mixed hyperlipidemia 09/04/2019  . Spontaneous pneumothorax 08/25/2019  . Tobacco dependence 08/25/2019  . 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 Spondylosis without myelopathy or radiculopathy, cervical region 04/10/2018  . Overweight 01/24/2018  . Genital warts 10/04/2016  . Tobacco use 11/27/2015  . Total bilirubin, elevated 11/26/2015  . Low HDL (under 40) 11/26/2015      Current Outpatient Medications:  Marland Kitchen  Melatonin 5 MG TABS, Take 5 mg by mouth at bedtime as needed (sleep)., Disp: , Rfl:  .  multivitamin (ONE-A-DAY MEN'S) TABS tablet, Take 1 tablet by mouth daily., Disp: , Rfl:    Allergies  Allergen Reactions  . Amoxil [Amoxicillin] Rash     Past Surgical History:  Procedure Laterality Date  . INCISIONAL HERNIA REPAIR    . NASAL FRACTURE SURGERY       Family History  Problem Relation Age of Onset  . Hypertension Mother   . Diabetes Father   . Asthma Son   . Allergies Son        peanuts     Social History   Tobacco Use  . Smoking status: Current Every Day Smoker    Packs/day: 1.00    Years: 12.00    Pack years: 12.00    Types: Cigarettes  . Smokeless tobacco: Never Used  Substance Use Topics  . Alcohol use: Yes    Alcohol/week: 0.0 standard drinks    Comment: occassional    With staff assistance, above reviewed with the patient today.  ROS: As per HPI, otherwise no specific complaints on a limited and focused system review   No results found for this or any previous visit (from the past 72 hour(s)).   PHQ2/9: Depression screen [facility] 2/9 09/04/2019 08/25/2019 02/11/2019 08/11/2018 01/24/2018    Decreased Interest 0 0 0 0 0  Down, Depressed, Hopeless 0 0 0 0 0  PHQ - 2 Score 0 0 0 0 0  Altered sleeping 0 0 0 0 -  Tired, decreased energy 0 0 0 0 -  Change in appetite 0 0 0 0 -  Feeling bad or failure about yourself  0 0 0 0 -  Trouble concentrating 0 0 0 0 -  Moving slowly or fidgety/restless 0 0 0 0 -  Suicidal thoughts 0 0 0 0 -  PHQ-9 Score 0 0 0 0 -  Difficult doing work/chores Not difficult at all Not difficult at all Not difficult at all Not difficult at all -   PHQ-2/9 Result is neg  Fall Risk: Fall Risk  09/04/2019 08/25/2019 02/11/2019 08/11/2018 01/24/2018  Falls in the past year? 0 0 0 0 No  Number falls in past yr: 0 0 0 0 -  Injury with Fall? 0 0 0 0 -      Objective:   Vitals:   09/04/19 0848  BP: 116/70  Pulse: 97  Resp: 16  Temp: 97.8 F (36.6 C)  TempSrc: Temporal  SpO2: 97%  Weight: 199 lb 12.8 oz (90.6 kg)  Height: 5\' 8"  (1.727 m)    Body mass index is 30.38 kg/m.  Physical Exam   NAD, masked, pleasant HEENT - [place]/AT, sclera anicteric, pharynx clear Neck - supple, carotids 2+ and = without bruits bilat Car - RRR without m/g/r, not tachy on my exam with heart rate approximately 80s and regular Pulm- RR and effort normal at rest, CTA without wheeze or rales with a question of diminished breath sounds in the upper left lung field versus right lung field on exam.  No pain noted with deep inspiration. Abd - soft, NT diffusely Ext - no LE edema,  Neuro/psychiatric - affect was not flat, appropriate with conversation  Alert and oriented  Grossly non-focal   Speech normal       Assessment & Plan:   1. Spontaneous pneumothorax A follow-up chest x-ray last visit was unremarkable for any recurrence of the pneumothorax, and noted just some mild atelectasis, likely sequela from him not taking very deep breaths previous.  Concerned with these more recent increased symptoms again, a week ago having marked pain noted, and still some intermittent pains  throughout this past week including some dyspnea on exertion.  Combined with the exam noted, do feel getting a repeat chest x-ray urgently this morning is needed.  Asked to get that result back quickly and will communicate that with the patient when it is back. If has a significant recurrence of a pneumothorax, will be referred to more of an emergency type setting. If the x-ray is okay, we will continue to monitor, with still no vigorous exercise, just progressing with activities of daily living in the short-term. Emphasized if he has more severe pains again, especially if his heart feels like it is racing, any more concerning associated symptoms, he should go to an emergency setting, as if there is recurrence of a pneumothorax, it is important to get evaluated immediately.  He was understanding of that. - DG Chest 2 View; Future  2. Tobacco use Again strongly encouraged tobacco cessation, and if the nicotine supplements or not helpful, can again add a Chantix type product to try to help.  3. Overweight Also noted the importance of keeping weight controlled.  Not to resume any regular exercise presently as await the x-ray result, and improvements in symptoms from this recent spontaneous pneumothorax.  4. Mixed hyperlipidemia Noted this in his past, and will recheck labs again in the near future.  5. Other chest pain As above - DG Chest 2 View; Future  6. Dyspnea on exertion As above - DG Chest 2 View; Future  We will schedule follow-up in 4 weeks time at the latest, sooner as needed.      , MD 09/04/19 8:52 AM

## 2019-09-04 ENCOUNTER — Encounter: Payer: Self-pay | Admitting: Internal Medicine

## 2019-09-04 ENCOUNTER — Telehealth: Payer: Self-pay

## 2019-09-04 ENCOUNTER — Ambulatory Visit
Admission: RE | Admit: 2019-09-04 | Discharge: 2019-09-04 | Disposition: A | Discharge status: Home / Self Care | Payer: PRIVATE HEALTH INSURANCE | Source: Ambulatory Visit | Attending: Internal Medicine | Admitting: Internal Medicine

## 2019-09-04 ENCOUNTER — Other Ambulatory Visit: Payer: Self-pay

## 2019-09-04 ENCOUNTER — Ambulatory Visit: Payer: PRIVATE HEALTH INSURANCE | Admitting: Internal Medicine

## 2019-09-04 VITALS — BP 116/70 | HR 97 | Temp 97.8°F | Resp 16 | Ht 68.0 in | Wt 199.8 lb

## 2019-09-04 DIAGNOSIS — R0789 Other chest pain: Secondary | ICD-10-CM | POA: Diagnosis present

## 2019-09-04 DIAGNOSIS — Z720 Tobacco use: Secondary | ICD-10-CM

## 2019-09-04 DIAGNOSIS — J9383 Other pneumothorax: Secondary | ICD-10-CM

## 2019-09-04 DIAGNOSIS — R0600 Dyspnea, unspecified: Secondary | ICD-10-CM | POA: Insufficient documentation

## 2019-09-04 DIAGNOSIS — E782 Mixed hyperlipidemia: Secondary | ICD-10-CM

## 2019-09-04 DIAGNOSIS — E663 Overweight: Secondary | ICD-10-CM | POA: Diagnosis not present

## 2019-09-04 DIAGNOSIS — R0609 Other forms of dyspnea: Secondary | ICD-10-CM

## 2019-09-04 NOTE — Telephone Encounter (Signed)
Called patient to give normal chest x-ray results per Dr. Dorris Fetch. No answer, left him a VM and told him to check his mychart for message as well.

## 2019-09-04 NOTE — Patient Instructions (Signed)
Please obtain chest x-ray immediately after our visit.

## 2019-09-04 NOTE — Telephone Encounter (Signed)
Copied from CRM [id number]. Topic: General - Other >> Sep 04, 2019 10:26 AM Jaquita Rector A wrote: Reason for CRM: Outpatient Imaging called to inform Dr Dorris Fetch that patients results are in

## 2019-09-22 NOTE — Telephone Encounter (Signed)
Error patient called the wrong office to schedule appt . Transferred to [facility]

## 2019-10-01 NOTE — Progress Notes (Signed)
Patient ID: Douglas Greer, male    DOB: 03-Nov-1990, 29 y.o.   MRN: [id number]  PCP: Towanda Malkin, MD  Chief Complaint  Patient presents with  . Follow-up  . Spontanious Pneumothorax  . Hyperlipidemia    Subjective:   Douglas Greer is a 29 y.o. male, presents to clinic with CC of the following:  Chief Complaint  Patient presents with  . Follow-up  . Spontanious Pneumothorax  . Hyperlipidemia    HPI:  Patient is a 29 year old male who I first met 08/25/19 for an outpatient follow-up after being seen in the emergency room 3/18 for chest pain and a spontaneous pneumothorax noted. Follows up today.  Spont pneumothorax His most recent follow-up was 09/04/2019 and he remained improved clinically.  Question some mild symptoms at that visit, and a repeat chest x-ray was felt best to obtain. Chest x-ray obtained on the initial follow-up visit showed minimal atelectasis, with the follow-up chest x-ray obtained on 09/04/2019 being a normal study.  There was no evidence of recurrent pneumothorax He notes he has been feeling well since our last visit, definitely notes symptoms have improved, although still notes some mild intermittent chest pain feelings and that left upper chest region, predominantly when he is more active when walking, mowing his lawn noted.  The pains are not severe, and not like he has had prior.  Denies palpitations, increased shortness of breath, legs are not swelling. He has not increased his exercise significantly, just trying to be more active with walking, and some activities around the home. He definitely noted that all in all, things have continued to improve.   Tobacco use/dep Has been trying to quit tobacco use recently, strongly encouraged tobacco cessation on recent visits. He has tried the nicotine patch without much success, and notes may be smoking a cigarette or 2 less a day.     Overweight Wt Readings from Last 3 Encounters:    10/02/19 202 lb 6.4 oz (91.8 kg)  09/04/19 199 lb 12.8 oz (90.6 kg)  08/25/19 200 lb (90.7 kg)   His weight is not significantly changed, up 3 pounds from his last visit  Again today noted the importance of keeping weight controlled.    Mixed hyperlipidemia Lab Results  Component Value Date   CHOL 165 02/11/2019   HDL 17 (L) 02/11/2019   LDLCALC 107 (H) 02/11/2019   TRIG 304 (H) 02/11/2019   CHOLHDL 9.7 (H) 02/11/2019  + FH - Dad's side of family Noted this in his past, and will recheck labs again in the near future.  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 Mixed hyperlipidemia 09/04/2019  . Spontaneous pneumothorax 08/25/2019  . Tobacco dependence 08/25/2019  . 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 Spondylosis without myelopathy or radiculopathy, cervical region 04/10/2018  . Overweight 01/24/2018  . Genital warts 10/04/2016  . Tobacco use 11/27/2015  . Total bilirubin, elevated 11/26/2015  . Low HDL (under 40) 11/26/2015      Current Outpatient Medications:  Marland Kitchen  Melatonin 5 MG TABS, Take 5 mg by mouth at bedtime as needed (sleep)., Disp: , Rfl:  .  multivitamin (ONE-A-DAY MEN'S) TABS tablet, Take 1 tablet by mouth daily., Disp: , Rfl:    Allergies  Allergen Reactions  . Amoxil [Amoxicillin]  Rash     Past Surgical History:  Procedure Laterality Date  . INCISIONAL HERNIA REPAIR    . NASAL FRACTURE SURGERY       Family History  Problem Relation Age of Onset  . Hypertension Mother   . Diabetes Father   . Asthma Son   . Allergies Son        peanuts     Social History   Tobacco Use  . Smoking status: Current Every Day Smoker    Packs/day: 1.00    Years: 12.00    Pack years: 12.00    Types: Cigarettes  . Smokeless tobacco: Never Used   Substance Use Topics  . Alcohol use: Yes    Alcohol/week: 0.0 standard drinks    Comment: occassional    With staff assistance, above reviewed with the patient today.  ROS: As per HPI, otherwise no specific complaints on a limited and focused system review   No results found for this or any previous visit (from the past 72 hour(s)).   PHQ2/9: Depression screen [facility] 2/9 10/02/2019 09/04/2019 08/25/2019 02/11/2019 08/11/2018  Decreased Interest 0 0 0 0 0  Down, Depressed, Hopeless 0 0 0 0 0  PHQ - 2 Score 0 0 0 0 0  Altered sleeping 0 0 0 0 0  Tired, decreased energy 0 0 0 0 0  Change in appetite 0 0 0 0 0  Feeling bad or failure about yourself  0 0 0 0 0  Trouble concentrating 0 0 0 0 0  Moving slowly or fidgety/restless 0 0 0 0 0  Suicidal thoughts 0 0 0 0 0  PHQ-9 Score 0 0 0 0 0  Difficult doing work/chores Not difficult at all Not difficult at all Not difficult at all Not difficult at all Not difficult at all   PHQ-2/9 Result is neg  Fall Risk: Fall Risk  10/02/2019 09/04/2019 08/25/2019 02/11/2019 08/11/2018  Falls in the past year? 0 0 0 0 0  Number falls in past yr: 0 0 0 0 0  Injury with Fall? 0 0 0 0 0      Objective:   Vitals:   10/02/19 0842  BP: 120/80  Pulse: 86  Resp: 16  Temp: (!) 97.1 F (36.2 C)  TempSrc: Temporal  SpO2: 96%  Weight: 202 lb 6.4 oz (91.8 kg)  Height: 5' 8"  (1.727 m)    Body mass index is 30.77 kg/m.  Physical Exam   NAD, masked, looks well, pleasant HEENT - [place]/AT, sclera anicteric, pharynx clear Neck - supple, Car - RRR without m/g/r, not tachycardic Pulm- RR and effort normal at rest, CTA without wheeze or rales, good air movement noted throughout With palpating the left chest wall, he noted discomfort with palpation in the area over the left breast and slightly beneath.  Not marked. Abd - soft, NT diffusely, Ext - no LE edema,  Neuro/psychiatric - affect was not flat, appropriate with conversation  Alert and oriented, speech  normal      Assessment & Plan:   1. Spontaneous pneumothorax Overall, he notes definitely is remaining improved, although still not completely free of symptoms as trying to get more active. May have a chest wall component to his discomfort, as he did note some discomfort with my palpation today. Have done 2 x-rays for follow-up to date, with no evidence of pneumothorax noted on those. Encouraged se definitely feels improved, although would like to see him getting back to being more active without symptoms  in the very near future.  2. Tobacco dependence Again encouraged complete tobacco cessation today and the importance of that, and he will continue with efforts. May add a Chantix type product in the future, if not having success with the nicotine supplements, and noted the importance of really being committed if we do try that.  3. Overweight Importance of keeping weight controlled noted again today as well. Hoping to tolerate more exercise over the next few weeks to help with this.  4. Mixed hyperlipidemia Included some information on high triglycerides in the AVS today, and encouraged diet modifications to help.  As above, hopefully tolerating more exercise to help as well over time. Plans to recheck a lipid panel again in the fall.  Agreed not to repeat a chest x-ray today, and slowly increase his activities over the next weeks and see how he tolerates. We will follow-up again in approximately 8 weeks, sooner as needed. He is aware if he has recurrence of those sharp pains like he had previous with any increased shortness of breath, rapid heart rate, he needs to follow-up more emergently in an ER setting.     Towanda Malkin, MD 10/02/19 9:06 AM

## 2019-10-02 ENCOUNTER — Encounter: Payer: Self-pay | Admitting: Internal Medicine

## 2019-10-02 ENCOUNTER — Ambulatory Visit: Payer: PRIVATE HEALTH INSURANCE | Admitting: Internal Medicine

## 2019-10-02 ENCOUNTER — Other Ambulatory Visit: Payer: Self-pay

## 2019-10-02 VITALS — BP 120/80 | HR 86 | Temp 97.1°F | Resp 16 | Ht 68.0 in | Wt 202.4 lb

## 2019-10-02 DIAGNOSIS — E663 Overweight: Secondary | ICD-10-CM | POA: Diagnosis not present

## 2019-10-02 DIAGNOSIS — E782 Mixed hyperlipidemia: Secondary | ICD-10-CM | POA: Diagnosis not present

## 2019-10-02 DIAGNOSIS — F17200 Nicotine dependence, unspecified, uncomplicated: Secondary | ICD-10-CM

## 2019-10-02 DIAGNOSIS — J9383 Other pneumothorax: Secondary | ICD-10-CM

## 2019-10-02 NOTE — Patient Instructions (Signed)
High Triglycerides Eating Plan Triglycerides are a type of fat in the blood. High levels of triglycerides can increase your risk of heart disease and stroke. If your triglyceride levels are high, choosing the right foods can help lower your triglycerides and keep your heart healthy. Work with your health care provider or a diet and nutrition specialist (dietitian) to develop an eating plan that is right for you. What are tips for following this plan? General guidelines   Lose weight, if you are overweight. For most people, losing 5-10 lbs (2-5 kg) helps lower triglyceride levels. A weight-loss plan may include. ? 30 minutes of exercise at least 5 days a week. ? Reducing the amount of calories, sugar, and fat you eat.  Eat a wide variety of fresh fruits, vegetables, and whole grains. These foods are high in fiber.  Eat foods that contain healthy fats, such as fatty fish, nuts, seeds, and olive oil.  Avoid foods that are high in added sugar, added salt (sodium), saturated fat, and trans fat.  Avoid low-fiber, refined carbohydrates such as white bread, crackers, noodles, and white rice.  Avoid foods with partially hydrogenated oils (trans fats), such as fried foods or stick margarine.  Limit alcohol intake to no more than 1 drink a day for nonpregnant women and 2 drinks a day for men. One drink equals 12 oz of beer, 5 oz of wine, or 1 oz of hard liquor. Your health care provider may recommend that you drink less depending on your overall health. Reading food labels  Check food labels for the amount of saturated fat. Choose foods with no or very little saturated fat.  Check food labels for the amount of trans fat. Choose foods with no trans fat.  Check food labels for the amount of cholesterol. Choose foods low in cholesterol. Ask your dietitian how much cholesterol you should have each day.  Check food labels for the amount of sodium. Choose foods with less than 140 milligrams (mg) per  serving. Shopping  Buy dairy products labeled as nonfat (skim) or low-fat (1%).  Avoid buying processed or prepackaged foods. These are often high in added sugar, sodium, and fat. Cooking  Choose healthy fats when cooking, such as olive oil or canola oil.  Cook foods using lower fat methods, such as baking, broiling, boiling, or grilling.  Make your own sauces, dressings, and marinades when possible, instead of buying them. Store-bought sauces, dressings, and marinades are often high in sodium and sugar. Meal planning  Eat more home-cooked food and less restaurant, buffet, and fast food.  Eat fatty fish at least 2 times each week. Examples of fatty fish include salmon, trout, mackerel, tuna, and herring.  If you eat whole eggs, do not eat more than 3 egg yolks per week. What foods are recommended? The items listed may not be a complete list. Talk with your dietitian about what dietary choices are best for you. Grains Whole wheat or whole grain breads, crackers, cereals, and pasta. Unsweetened oatmeal. Bulgur. Barley. Quinoa. Brown rice. Whole wheat flour tortillas. Vegetables Fresh or frozen vegetables. Low-sodium canned vegetables. Fruits All fresh, canned (in natural juice), or frozen fruits. Meats and other protein foods Skinless chicken or turkey. Ground chicken or turkey. Lean cuts of pork, trimmed of fat. Fish and seafood, especially salmon, trout, and herring. Egg whites. Dried beans, peas, or lentils. Unsalted nuts or seeds. Unsalted canned beans. Natural peanut or almond butter. Dairy Low-fat dairy products. Skim or low-fat (1%) milk. Reduced fat (  2%) and low-sodium cheese. Low-fat ricotta cheese. Low-fat cottage cheese. Plain, low-fat yogurt. Fats and oils Tub margarine without trans fats. Light or reduced-fat mayonnaise. Light or reduced-fat salad dressings. Avocado. Safflower, olive, sunflower, soybean, and canola oils. What foods are not recommended? The items listed  may not be a complete list. Talk with your dietitian about what dietary choices are best for you. Grains White bread. White (regular) pasta. White rice. Cornbread. Bagels. Pastries. Crackers that contain trans fat. Vegetables Creamed or fried vegetables. Vegetables in a cheese sauce. Fruits Sweetened dried fruit. Canned fruit in syrup. Fruit juice. Meats and other protein foods Fatty cuts of meat. Ribs. Chicken wings. Bacon. Sausage. Bologna. Salami. Chitterlings. Fatback. Hot dogs. Bratwurst. Packaged lunch meats. Dairy Whole or reduced-fat (2%) milk. Half-and-half. Cream cheese. Full-fat or sweetened yogurt. Full-fat cheese. Nondairy creamers. Whipped toppings. Processed cheese or cheese spreads. Cheese curds. Beverages Alcohol. Sweetened drinks, such as soda, lemonade, fruit drinks, or punches. Fats and oils Butter. Stick margarine. Lard. Shortening. Ghee. Bacon fat. Tropical oils, such as coconut, palm kernel, or palm oils. Sweets and desserts Corn syrup. Sugars. Honey. Molasses. Candy. Jam and jelly. Syrup. Sweetened cereals. Cookies. Pies. Cakes. Donuts. Muffins. Ice cream. Condiments Store-bought sauces, dressings, and marinades that are high in sugar, such as ketchup and barbecue sauce. Summary  High levels of triglycerides can increase the risk of heart disease and stroke. Choosing the right foods can help lower your triglycerides.  Eat plenty of fresh fruits, vegetables, and whole grains. Choose low-fat dairy and lean meats. Eat fatty fish at least twice a week.  Avoid processed and prepackaged foods with added sugar, sodium, saturated fat, and trans fat.  If you need suggestions or have questions about what types of food are good for you, talk with your health care provider or a dietitian. This information is not intended to replace advice given to you by your health care provider. Make sure you discuss any questions you have with your health care provider. Document Revised:  05/03/2017 Document Reviewed: 07/24/2016 Elsevier Patient Education  2020 [employer] Inc.  

## 2019-12-01 NOTE — Progress Notes (Deleted)
Patient

## 2019-12-02 ENCOUNTER — Ambulatory Visit: Payer: PRIVATE HEALTH INSURANCE | Admitting: Internal Medicine

## 2020-02-03 ENCOUNTER — Telehealth: Payer: Self-pay

## 2020-02-03 NOTE — Telephone Encounter (Signed)
Copied from CRM [id number]. Topic: General - Other >> Feb 02, 2020  3:55 PM Mcneil, Ja-Kwan wrote: Reason for CRM: Pt would like to stop by to pick up a copy of his immunization records. Pt requests call back

## 2020-02-03 NOTE — Telephone Encounter (Signed)
lvm to inform pt that immunization record is ready for pick up

## 2020-02-24 ENCOUNTER — Telehealth: Payer: Self-pay

## 2020-02-24 NOTE — Telephone Encounter (Signed)
Returned patient's call, no answer, I left him a voicemail to return my call.

## 2020-02-24 NOTE — Telephone Encounter (Signed)
I spoke with the patient and informed him of Dr. Sunday Corn recommendation. The patient agrees to report to the ED or urgent care.   He will keep his appointment with our office for October 1 and move it up if necessary.

## 2020-02-24 NOTE — Telephone Encounter (Signed)
I do feel patient should be evaluated more urgently in an urgent care or emergent setting.

## 2020-02-24 NOTE — Telephone Encounter (Signed)
Patient called back and reports chest pain started 2 days ago. Patient describes the pain as dull and more on left side of chest. He denies feeling dizzy or disoriented. Patient states he has had this chest pain before in March 2021 and was evaluated by ED.

## 2020-03-04 ENCOUNTER — Ambulatory Visit: Payer: PRIVATE HEALTH INSURANCE | Admitting: Internal Medicine

## 2020-03-04 NOTE — Progress Notes (Deleted)
Patient is a 29 year old male who I first met 08/25/19 for an outpatient follow-up after being seen in the emergency room 3/18 for chest pain and a spontaneous pneumothorax noted. Last visit was October 02, 2019, with him noting symptoms much improved, although still not completely without symptoms of chest discomfort. Agreed to slowly increase his activities over the next weeks, and a follow-up planned about 8 weeks after, Sooner as needed

## 2020-03-07 ENCOUNTER — Ambulatory Visit
Admission: EM | Admit: 2020-03-07 | Discharge: 2020-03-07 | Disposition: A | Discharge status: Home / Self Care | Payer: PRIVATE HEALTH INSURANCE | Attending: Emergency Medicine | Admitting: Emergency Medicine

## 2020-03-07 ENCOUNTER — Other Ambulatory Visit: Payer: Self-pay

## 2020-03-07 DIAGNOSIS — F17200 Nicotine dependence, unspecified, uncomplicated: Secondary | ICD-10-CM | POA: Diagnosis not present

## 2020-03-07 DIAGNOSIS — R030 Elevated blood-pressure reading, without diagnosis of hypertension: Secondary | ICD-10-CM | POA: Insufficient documentation

## 2020-03-07 DIAGNOSIS — J069 Acute upper respiratory infection, unspecified: Secondary | ICD-10-CM | POA: Insufficient documentation

## 2020-03-07 LAB — POCT RAPID STREP A (OFFICE): Rapid Strep A Screen: NEGATIVE

## 2020-03-07 MED ORDER — BENZONATATE 100 MG PO CAPS: 100.0000 mg | ORAL_CAPSULE | Freq: Three times a day (TID) | ORAL | 0 refills | Status: DC

## 2020-03-07 NOTE — ED Provider Notes (Signed)
Douglas Greer    CSN: [id number] Arrival date & time: 03/07/20  3159      History   Chief Complaint Chief Complaint  Patient presents with   Sore Throat   Cough   Nasal Congestion    HPI Douglas Greer is a 29 y.o. male.   Patient presents with 4-day history of nasal congestion, nonproductive cough, sore throat.  He has been taking OTC cold medication for this.  He denies fever, chills, earache, shortness of breath, vomiting, diarrhea, or other symptoms.  He reports having a negative COVID test last week and declines COVID test today.  Patient smokes 1 PPD.  He has had one COVID vaccine.  The history is provided by the patient.    Past Medical History:  Diagnosis Date   Colitis presumed infectious 12/24/2018   Elevated hemoglobin (HCC) 06/21/2017   High hematocrit 06/21/2017   Low HDL (under 40) 11/26/2015   Neck arthritis 09/27/2017   Tobacco use 11/27/2015   Verruca plana 11/25/2015    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/10/2018   Overweight 01/24/2018   Genital warts 10/04/2016   Tobacco use 11/27/2015   Total bilirubin, elevated 11/26/2015   Low HDL (under 40) 11/26/2015    Past Surgical History:  Procedure Laterality Date   INCISIONAL HERNIA REPAIR     NASAL FRACTURE SURGERY         Home Medications    Prior to Admission medications   Medication Sig Start Date End Date Taking? Authorizing Provider  multivitamin (ONE-A-DAY MEN'S) TABS tablet Take 1 tablet by mouth daily.   Yes [provider]  benzonatate (TESSALON) 100 MG capsule Take 1 capsule (100 mg total) by mouth every 8 (eight) hours. 03/07/20   Mickie Bail, NP  Melatonin 5 MG TABS Take 5 mg by mouth  at bedtime as needed (sleep).    [provider]    Family History Family History  Problem Relation Age of Onset   Hypertension Mother    Diabetes Father    Asthma Son    Allergies Son        peanuts    Social History Social History   Tobacco Use   Smoking status: Current Every Day Smoker    Packs/day: 1.00    Years: 12.00    Pack years: 12.00    Types: Cigarettes   Smokeless tobacco: Never Used  Building services engineer Use: Never used  Substance Use Topics   Alcohol use: Yes    Alcohol/week: 0.0 standard drinks    Comment: occassional   Drug use: Yes    Types: Marijuana     Allergies   Amoxil [amoxicillin]   Review of Systems Review of Systems  Constitutional: Negative for chills and fever.  HENT: Positive for congestion and sore throat. Negative for ear pain.   Eyes: Negative for pain and visual disturbance.  Respiratory: Positive for cough. Negative for shortness of breath.   Cardiovascular: Negative for chest pain and palpitations.  Gastrointestinal: Negative for abdominal pain, diarrhea and vomiting.  Genitourinary: Negative for dysuria and hematuria.  Musculoskeletal: Negative for arthralgias and back pain.  Skin: Negative for color change and rash.  Neurological: Negative for seizures and syncope.  All other systems reviewed and are negative.  Physical Exam Triage Vital Signs ED Triage Vitals  Enc Vitals Group     BP      Pulse      Resp      Temp      Temp src      SpO2      Weight      Height      Head Circumference      Peak Flow      Pain Score      Pain Loc      Pain Edu?      Excl. in GC?    No data found.  Updated Vital Signs BP (!) 142/97    Pulse 81    Temp 98.1 F (36.7 C)    Resp 16    SpO2 96%   Visual Acuity Right Eye Distance:   Left Eye Distance:   Bilateral Distance:    Right Eye Near:   Left Eye Near:    Bilateral Near:     Physical Exam Vitals and nursing note reviewed.  Constitutional:       General: He is not in acute distress.    Appearance: He is well-developed.  HENT:     Head: Normocephalic and atraumatic.     Right Ear: Tympanic membrane normal.     Left Ear: Tympanic membrane normal.     Nose: Nose normal.     Mouth/Throat:     Mouth: Mucous membranes are moist.     Pharynx: Posterior oropharyngeal erythema present. No oropharyngeal exudate.  Eyes:     Conjunctiva/sclera: Conjunctivae normal.  Cardiovascular:     Rate and Rhythm: Normal rate and regular rhythm.     Heart sounds: No murmur heard.   Pulmonary:     Effort: Pulmonary effort is normal. No respiratory distress.     Breath sounds: Normal breath sounds. No wheezing or rhonchi.  Abdominal:     Palpations: Abdomen is soft.     Tenderness: There is no abdominal tenderness. There is no guarding or rebound.  Musculoskeletal:     Cervical back: Neck supple.  Skin:    General: Skin is warm and dry.     Findings: No rash.  Neurological:     General: No focal deficit present.     Mental Status: He is alert and oriented to person, place, and time.     Gait: Gait normal.  Psychiatric:        Mood and Affect: Mood normal.        Behavior: Behavior normal.      UC Treatments / Results  Labs (all labs ordered are listed, but only abnormal results are displayed) Labs Reviewed  CULTURE, GROUP A STREP [facility])  POCT RAPID STREP A (OFFICE)    EKG   Radiology No results found.  Procedures Procedures (including critical care time)  Medications Ordered in UC Medications - No data to display  Initial Impression / Assessment and Plan / UC Course  I have reviewed the triage vital signs and the nursing notes.  Pertinent labs & imaging results that were available during my care of the patient were reviewed by me and considered in my medical decision making (see chart for details).   Viral URI.  Tobacco use disorder. Elevated blood pressure.  Patient declines COVID test.  Rapid strep negative;  culture pending.  Treating cough with Tessalon Perles.  Discussed symptomatic treatment with ibuprofen and Mucinex.  Discussed with patient that his blood pressure is elevated  today needs to be rechecked by his PCP in 2 to 4 weeks.  Discussed and education provided on steps to quit smoking.  Instructed patient to follow-up with his PCP to discuss this further.  Patient agrees to plan of care.   Final Clinical Impressions(s) / UC Diagnoses   Final diagnoses:  Viral URI  Tobacco use disorder  Elevated blood pressure reading     Discharge Instructions     Your rapid strep test is negative.  A throat culture is pending; we will call you if it is positive requiring treatment.    Take the prescribed Tessalon Perles as needed for cough.    Take ibuprofen as needed for fever or discomfort.  Take Mucinex as needed for congestion.  Your blood pressure is elevated today at 142/97.  Please have this rechecked by your primary care provider in 2-4 weeks.      See the attached information on steps to quit smoking.  Please discuss this with your primary care provider when you follow-up for your blood pressure.         ED Prescriptions    Medication Sig Dispense Auth. Provider   benzonatate (TESSALON) 100 MG capsule Take 1 capsule (100 mg total) by mouth every 8 (eight) hours. 21 capsule Mickie Bail, NP     PDMP not reviewed this encounter.   Mickie Bail, NP 03/07/20 [phone]

## 2020-03-07 NOTE — ED Triage Notes (Addendum)
Patient c/o sore throat, nasal congestion, and non-productive cough x4 days. Patient has had the first dose of his covid vaccination. Patient reports he [facility] a negative [facility].   Patient reports he was exposed to someone with rhinovirus one week ago.

## 2020-03-07 NOTE — Discharge Instructions (Addendum)
Your rapid strep test is negative.  A throat culture is pending; we will call you if it is positive requiring treatment.    Take the prescribed Tessalon Perles as needed for cough.    Take ibuprofen as needed for fever or discomfort.  Take Mucinex as needed for congestion.  Your blood pressure is elevated today at 142/97.  Please have this rechecked by your primary care provider in 2-4 weeks.      See the attached information on steps to quit smoking.  Please discuss this with your primary care provider when you follow-up for your blood pressure.

## 2020-03-09 LAB — CULTURE, GROUP A STREP (THRC)

## 2020-05-23 ENCOUNTER — Telehealth: Payer: Self-pay | Admitting: Internal Medicine

## 2020-05-23 ENCOUNTER — Other Ambulatory Visit: Payer: Self-pay | Admitting: Internal Medicine

## 2020-05-23 DIAGNOSIS — F489 Nonpsychotic mental disorder, unspecified: Secondary | ICD-10-CM

## 2020-05-23 NOTE — Telephone Encounter (Signed)
Ok to proceed with this referral, and I will put the referral in. Thanks

## 2020-05-23 NOTE — Telephone Encounter (Signed)
Pt called in to request a referral to see a psychiatrist. Pt would like to be seen at [facility].    Please assist.

## 2020-05-23 NOTE — Progress Notes (Signed)
Patient requested seeing a psychiatrist in [place], and had the name of the place that he would like to be seen.  A referral was placed for him.

## 2021-03-18 IMAGING — CR DG ABDOMEN ACUTE W/ 1V CHEST
4 series · 4 of 4 positions shown · non-contrast
Comparison: None.

CLINICAL DATA: Nausea, vomiting and diarrhea for 1 day.

EXAM:
DG ABDOMEN ACUTE W/ 1V CHEST

[chest pa]
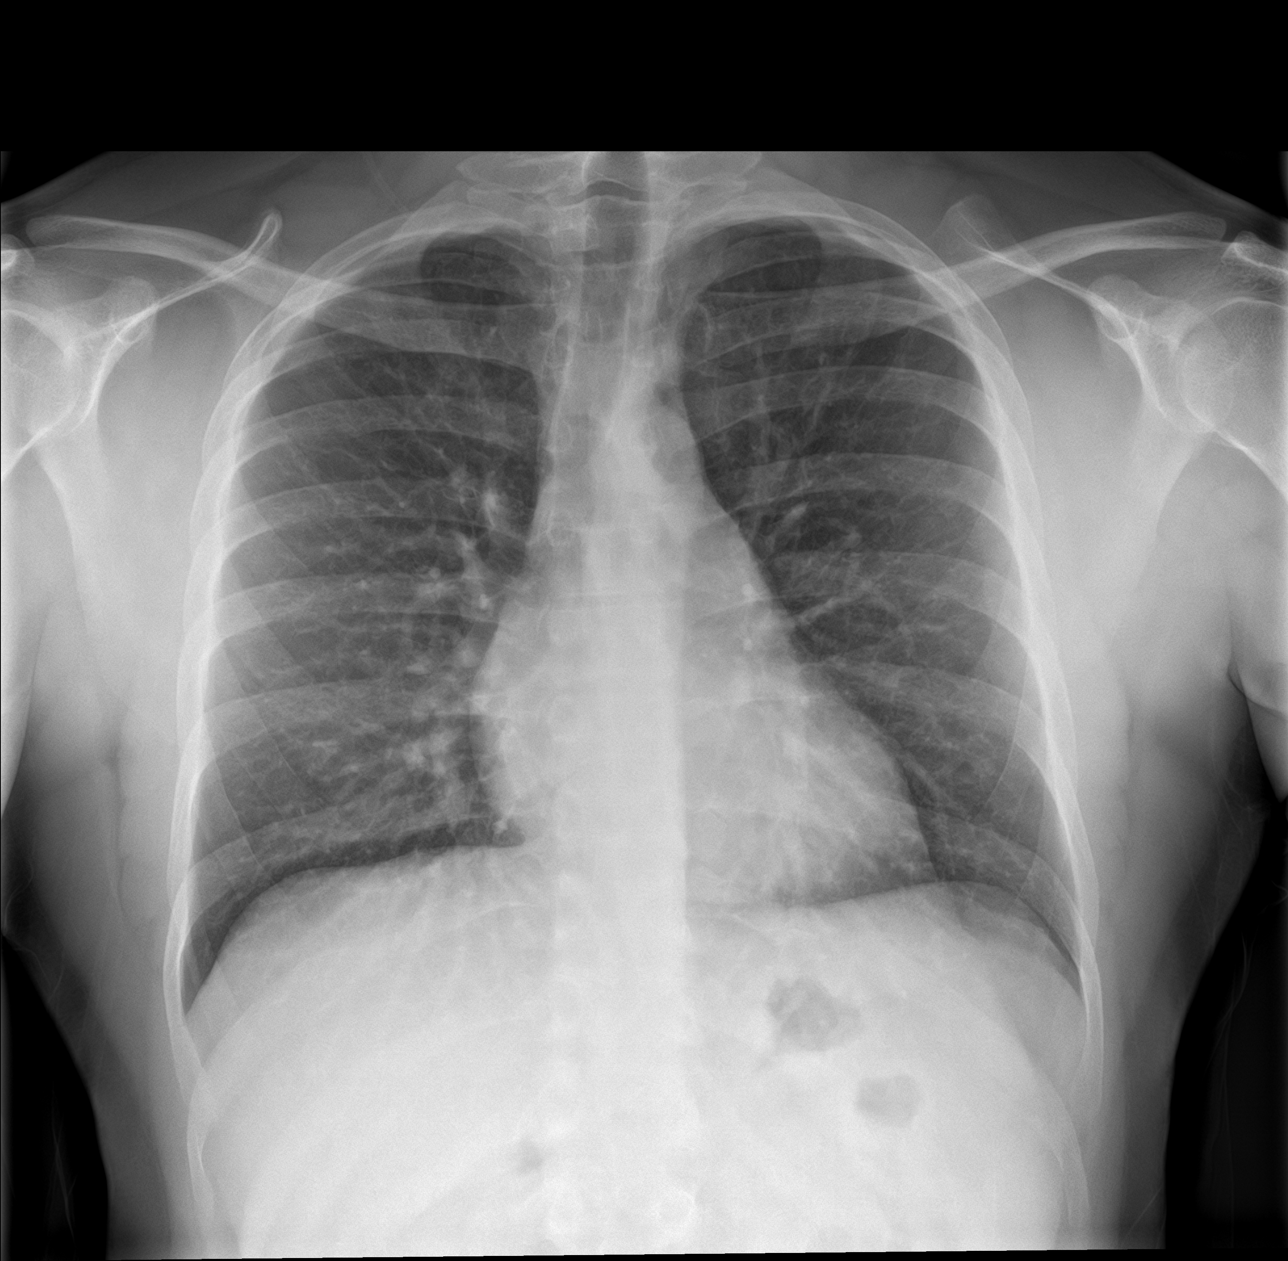

[abdomen erect]
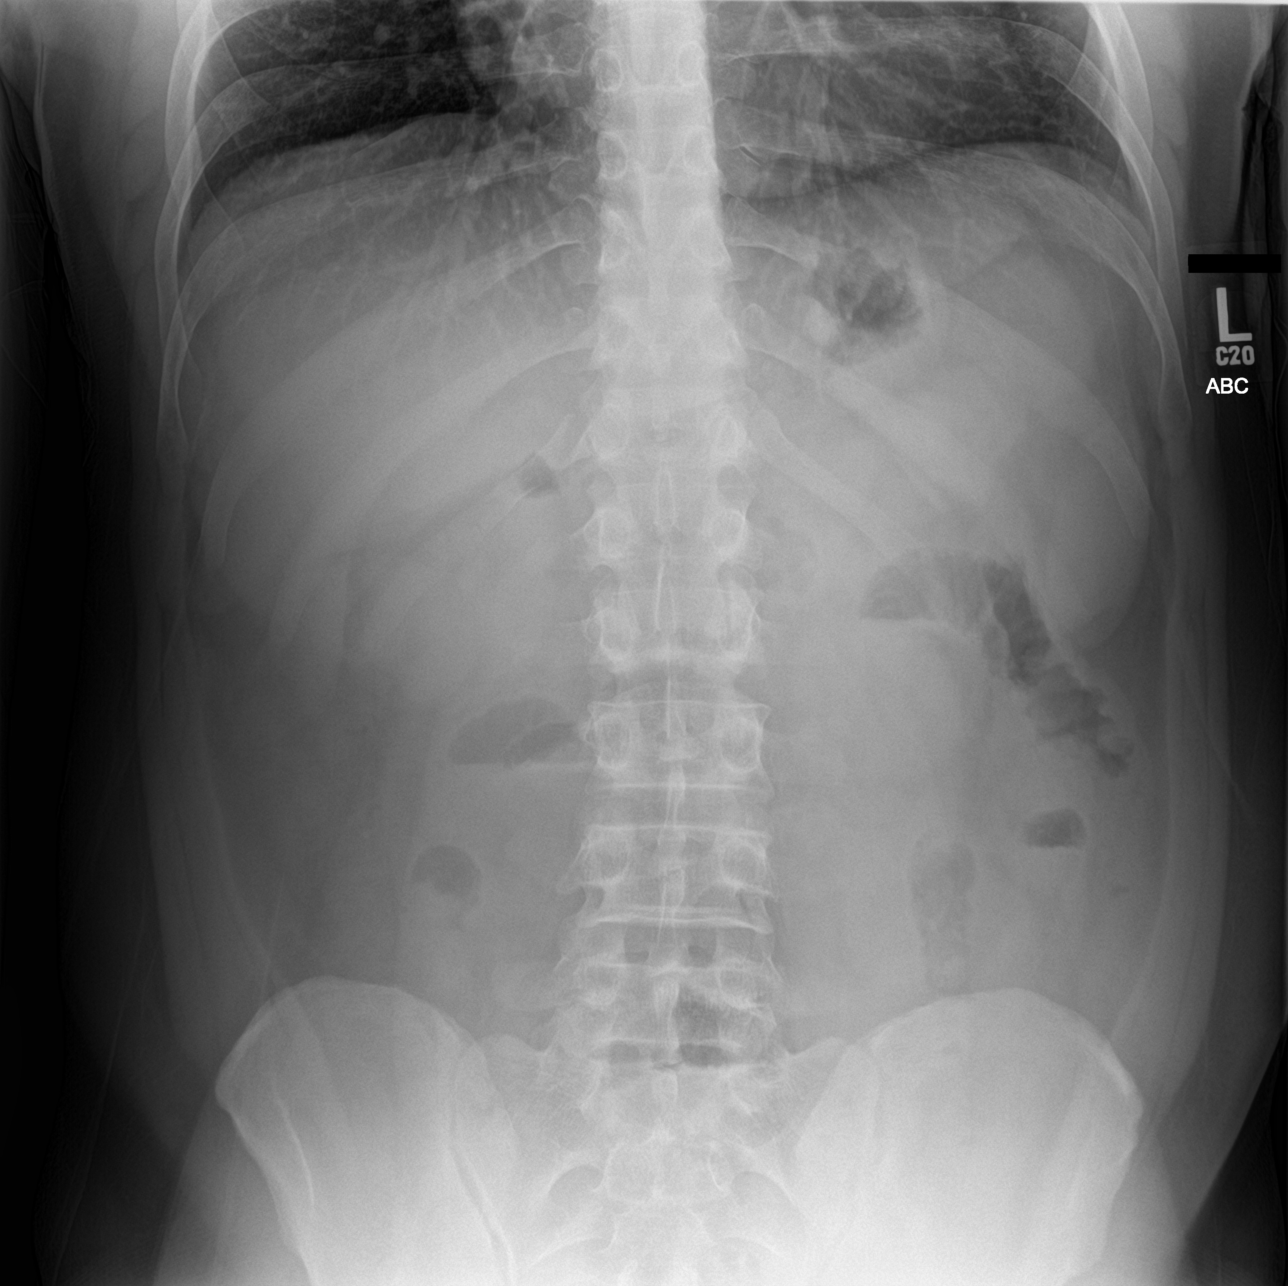

[abdomen supine (1 of 2)]
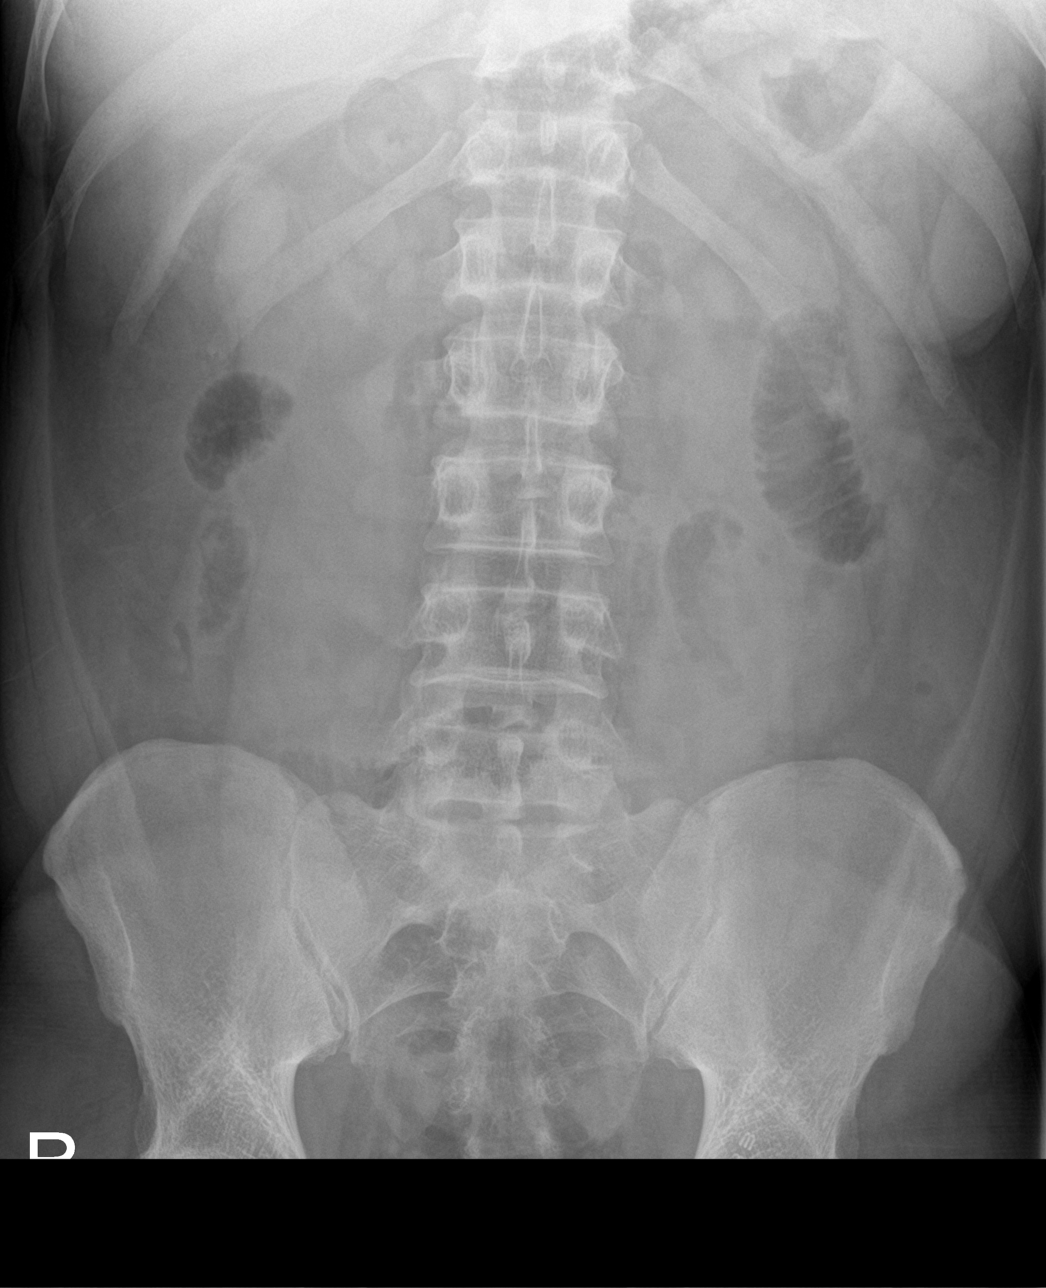

[abdomen supine (2 of 2)]
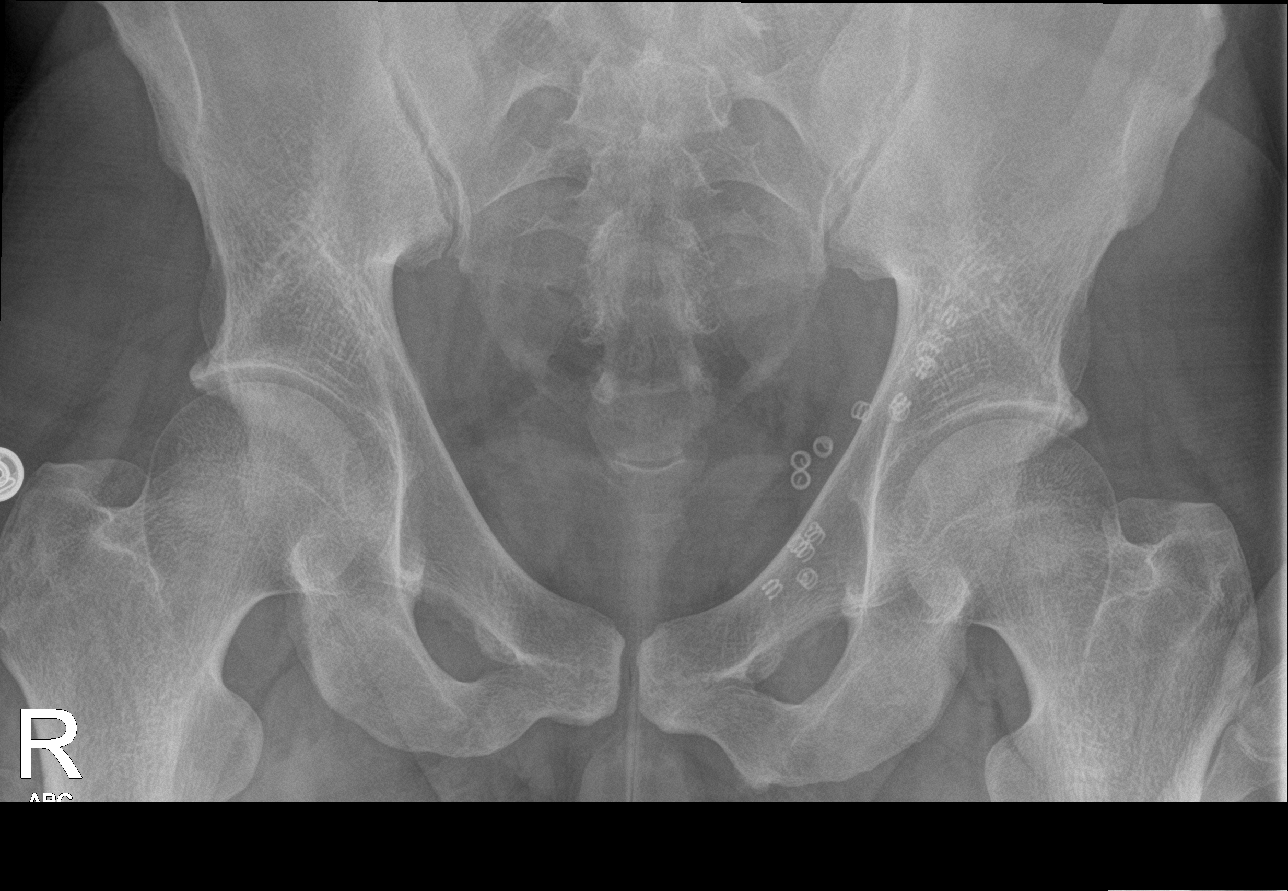

[4 of 4 positions shown; findings below may reference images not displayed]

FINDINGS: There are few air-filled mild dilated small bowel loops in the
abdomen with question bowel wall thickening. There is relative
paucity of bowel gas. No radiopaque calculi or other significant
radiographic abnormality is seen. Heart size and mediastinal
contours are within normal limits. Both lungs are clear.
IMPRESSION: A few air-filled mildly dilated small bowel loops in the abdomen
with question bowel wall thickening. Consider further evaluation
with CT abdomen pelvis with contrast.

No acute cardiopulmonary disease.

## 2021-08-03 ENCOUNTER — Encounter: Payer: Self-pay | Admitting: Nurse Practitioner

## 2021-08-03 ENCOUNTER — Other Ambulatory Visit: Payer: Self-pay

## 2021-08-03 ENCOUNTER — Ambulatory Visit (INDEPENDENT_AMBULATORY_CARE_PROVIDER_SITE_OTHER): Payer: Managed Care, Other (non HMO) | Admitting: Nurse Practitioner

## 2021-08-03 VITALS — BP 112/72 | HR 85 | Temp 98.1°F | Resp 18 | Ht 68.0 in | Wt 193.2 lb

## 2021-08-03 DIAGNOSIS — Z0000 Encounter for general adult medical examination without abnormal findings: Secondary | ICD-10-CM | POA: Diagnosis not present

## 2021-08-03 DIAGNOSIS — R5383 Other fatigue: Secondary | ICD-10-CM

## 2021-08-03 DIAGNOSIS — Z13220 Encounter for screening for lipoid disorders: Secondary | ICD-10-CM | POA: Diagnosis not present

## 2021-08-03 DIAGNOSIS — Z3009 Encounter for other general counseling and advice on contraception: Secondary | ICD-10-CM

## 2021-08-03 DIAGNOSIS — Z131 Encounter for screening for diabetes mellitus: Secondary | ICD-10-CM

## 2021-08-03 NOTE — Progress Notes (Signed)
Name: Douglas Greer   MRN: [id number]    DOB: Oct 12, 1990   Date:08/03/2021       Progress Note  Subjective  Chief Complaint  Chief Complaint  Patient presents with   Establish Care    HPI  Patient presents for annual CPE and acute problem, patient aware he may be charged.   IPSS Questionnaire (AUA-7): Over the past month   1)  How often have you had a sensation of not emptying your bladder completely after you finish urinating?  0 - Not at all  2)  How often have you had to urinate again less than two hours after you finished urinating? 0 - Not at all  3)  How often have you found you stopped and started again several times when you urinated?  0 - Not at all  4) How difficult have you found it to postpone urination?  0 - Not at all  5) How often have you had a weak urinary stream?  0 - Not at all  6) How often have you had to push or strain to begin urination?  0 - Not at all  7) How many times did you most typically get up to urinate from the time you went to bed until the time you got up in the morning?  1 - 1 time  Total score:  0-7 mildly symptomatic   8-19 moderately symptomatic   20-35 severely symptomatic     Diet: Tries to do well balanced, eats out a lot Exercise: walks twice a week for about thirty minutes.  Fatigue/ no desire to have sexual activity: He says it started last year after birth of third child. He says he would like to have his testosterone checked and would like a referral to urology for vasectomy.  Depression: phq 9 is negative Depression screen [facility] 2/9 08/03/2021 10/02/2019 09/04/2019 08/25/2019 02/11/2019  Decreased Interest 0 0 0 0 0  Down, Depressed, Hopeless 0 0 0 0 0  PHQ - 2 Score 0 0 0 0 0  Altered sleeping - 0 0 0 0  Tired, decreased energy - 0 0 0 0  Change in appetite - 0 0 0 0  Feeling bad or failure about yourself  - 0 0 0 0  Trouble concentrating - 0 0 0 0  Moving slowly or fidgety/restless - 0 0 0 0  Suicidal thoughts - 0 0 0 0  PHQ-9  Score - 0 0 0 0  Difficult doing work/chores - Not difficult at all Not difficult at all Not difficult at all Not difficult at all    Hypertension:  BP Readings from Last 3 Encounters:  08/03/21 112/72  03/07/20 (!) 142/97  10/02/19 120/80    Obesity: Wt Readings from Last 3 Encounters:  08/03/21 193 lb 3.2 oz (87.6 kg)  10/02/19 202 lb 6.4 oz (91.8 kg)  09/04/19 199 lb 12.8 oz (90.6 kg)   BMI Readings from Last 3 Encounters:  08/03/21 29.38 kg/m  10/02/19 30.77 kg/m  09/04/19 30.38 kg/m     Lipids:  Lab Results  Component Value Date   CHOL 165 02/11/2019   CHOL 175 08/11/2018   CHOL 173 01/24/2018   Lab Results  Component Value Date   HDL 17 (L) 02/11/2019   HDL 20 (L) 08/11/2018   HDL 18 (L) 01/24/2018   Lab Results  Component Value Date   LDLCALC 107 (H) 02/11/2019   LDLCALC 125 (H) 08/11/2018   LDLCALC 120 (H) 01/24/2018  Lab Results  Component Value Date   TRIG 304 (H) 02/11/2019   TRIG 179 (H) 08/11/2018   TRIG 231 (H) 01/24/2018   Lab Results  Component Value Date   CHOLHDL 9.7 (H) 02/11/2019   CHOLHDL 8.8 (H) 08/11/2018   CHOLHDL 9.6 (H) 01/24/2018   No results found for: LDLDIRECT Glucose:  Glucose, Bld  Date Value Ref Range Status  02/11/2019 95 65 - 99 mg/dL Final    Comment:    .            Fasting reference interval .   12/25/2018 105 (H) 70 - 99 mg/dL Final  40/98/119107/22/2020 478101 (H) 70 - 99 mg/dL Final    Flowsheet Row [facility]  AUDIT-C Score 1       Single STD testing and prevention (HIV/chl/gon/syphilis): 03/13/2019 Hep C: 03/13/2019  Skin cancer: Discussed monitoring for atypical lesions Colorectal cancer: no family history, no symptoms, does not qualify Prostate cancer: no family history, no symptoms, does not qualify No results found for: PSA   Lung cancer:   Low Dose CT Chest recommended if Age 60-80 years, 30 pack-year currently smoking OR have quit w/in 15years.  Patient does not qualify.   AAA:  The USPSTF recommends one-time screening with ultrasonography in men ages 3065 to 3975 years who have ever smoked ECG:  none  Vaccines:  HPV: up to at age 826 , ask insurance if age between 3827-45  Shingrix: 4550-64 yo and ask insurance if covered when patient above 33 yo Pneumonia:  educated and discussed with patient. Flu:  educated and discussed with patient. declined  Advanced Care Planning: A voluntary discussion about advance care planning including the explanation and discussion of advance directives.  Discussed health care proxy and Living will, and the patient was able to identify a health care proxy as mom, Erskine SquibbNakaia Guye.  Patient does not have a living will at present time. If patient does have living will, I have requested they bring this to the clinic to be scanned in to their chart.  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/10/2018   Overweight 01/24/2018   Genital warts 10/04/2016   Tobacco use 11/27/2015   Total bilirubin, elevated 11/26/2015   Low HDL (under 40) 11/26/2015    Past Surgical History:  Procedure Laterality Date   INCISIONAL HERNIA REPAIR     NASAL FRACTURE SURGERY      Family History  Problem Relation Age of Onset   Hypertension Mother    Diabetes Father    Asthma Son    Allergies Son        peanuts    Social History   Socioeconomic History   Marital status: Single    Spouse name: Not on file   Number of children: 2   Years of education: 12   Highest education level: Bachelor's degree (e.g., BA, AB, BS)  Occupational History   Occupation: IT  Tobacco Use   Smoking status: Every Day    Packs/day: 1.00    Years: 12.00    Pack years: 12.00    Types: Cigarettes    Smokeless tobacco: Never  Vaping Use   Vaping Use: Never used  Substance and Sexual Activity   Alcohol use: Yes  Alcohol/week: 0.0 standard drinks    Comment: occassional   Drug use: Yes    Types: Marijuana   Sexual activity: Yes  Other Topics Concern   Not on file  Social History Narrative   Not on file   Social Determinants of Health   Financial Resource Strain: Low Risk    Difficulty of Paying Living Expenses: Not hard at all  Food Insecurity: No Food Insecurity   Worried About Programme researcher, broadcasting/film/video in the Last Year: Never true   Ran Out of Food in the Last Year: Never true  Transportation Needs: No Transportation Needs   Lack of Transportation (Medical): No   Lack of Transportation (Non-Medical): No  Physical Activity: Insufficiently Active   Days of Exercise per Week: 2 days   Minutes of Exercise per Session: 30 min  Stress: No Stress Concern Present   Feeling of Stress : Only a little  Social Connections: Moderately Integrated   Frequency of Communication with Friends and Family: More than three times a week   Frequency of Social Gatherings with Friends and Family: More than three times a week   Attends Religious Services: More than 4 times per year   Active Member of [employer] or Organizations: Yes   Attends Engineer, structural: More than 4 times per year   Marital Status: Never married  Catering manager Violence: Not At Risk   Fear of Current or Ex-Partner: No   Emotionally Abused: No   Physically Abused: No   Sexually Abused: No     Current Outpatient Medications:    benzonatate (TESSALON) 100 MG capsule, Take 1 capsule (100 mg total) by mouth every 8 (eight) hours. (Patient not taking: Reported on 08/03/2021), Disp: 21 capsule, Rfl: 0   Melatonin 5 MG TABS, Take 5 mg by mouth at bedtime as needed (sleep). (Patient not taking: Reported on 08/03/2021), Disp: , Rfl:    multivitamin (ONE-A-DAY MEN'S) TABS tablet, Take 1 tablet by mouth daily. (Patient not  taking: Reported on 08/03/2021), Disp: , Rfl:   Allergies  Allergen Reactions   Amoxil [Amoxicillin] Rash     ROS  Constitutional: Negative for fever or weight change.  Respiratory: Negative for cough and shortness of breath.   Cardiovascular: Negative for chest pain or palpitations.  Gastrointestinal: Negative for abdominal pain, no bowel changes.  Musculoskeletal: Negative for gait problem or joint swelling.  Skin: Negative for rash.  Neurological: Negative for dizziness or headache.  No other specific complaints in a complete review of systems (except as listed in HPI above).    Objective  Vitals:   08/03/21 0847  BP: 112/72  Pulse: 85  Resp: 18  Temp: 98.1 F (36.7 C)  TempSrc: Oral  SpO2: 98%  Weight: 193 lb 3.2 oz (87.6 kg)  Height: 5\' 8"  (1.727 m)    Body mass index is 29.38 kg/m.  Physical Exam  Constitutional: Patient appears well-developed and well-nourished. No distress.  HENT: Head: Normocephalic and atraumatic. Ears: B TMs ok, no erythema or effusion; Nose: Nose normal.  Eyes: Conjunctivae and EOM are normal. Pupils are equal, round, and reactive to light. No scleral icterus.  Neck: Normal range of motion. Neck supple. No JVD present. No thyromegaly present.  Cardiovascular: Normal rate, regular rhythm and normal heart sounds.  No murmur heard. No BLE edema. Pulmonary/Chest: Effort normal and breath sounds normal. No respiratory distress. Abdominal: Soft. Bowel sounds are normal, no distension. There is no tenderness. no masses Musculoskeletal: Normal range of motion, no  joint effusions. No gross deformities Neurological: he is alert and oriented to person, place, and time. No cranial nerve deficit. Coordination, balance, strength, speech and gait are normal.  Skin: Skin is warm and dry. No rash noted. No erythema.  Psychiatric: Patient has a normal mood and affect. behavior is normal. Judgment and thought content normal.   No results found for this or  any previous visit (from the past 2160 hour(s)).   Fall Risk: Fall Risk  08/03/2021 10/02/2019 09/04/2019 08/25/2019 02/11/2019  Falls in the past year? 0 0 0 0 0  Number falls in past yr: 0 0 0 0 0  Injury with Fall? 0 0 0 0 0  Follow up Falls evaluation completed - - - -      Functional Status Survey: Is the patient deaf or have difficulty hearing?: No Does the patient have difficulty seeing, even when wearing glasses/contacts?: No Does the patient have difficulty concentrating, remembering, or making decisions?: No Does the patient have difficulty walking or climbing stairs?: No Does the patient have difficulty dressing or bathing?: No Does the patient have difficulty doing errands alone such as visiting a doctor's office or shopping?: No    Assessment & Plan  1. Physical exam, annual -increase physical activity -try and eat more well balanced - CBC with Differential/Platelet - COMPLETE METABOLIC PANEL WITH GFR - Lipid panel  2. Other fatigue  - CBC with Differential/Platelet - COMPLETE METABOLIC PANEL WITH GFR - Testosterone - TSH  3. Screening for cholesterol level  - Lipid panel  4. Screening for diabetes mellitus  - COMPLETE METABOLIC PANEL WITH GFR  5. Vasectomy evaluation  - Ambulatory referral to Urology    -Prostate cancer screening and PSA options (with potential risks and benefits of testing vs not testing) were discussed along with recent recs/guidelines. -USPSTF grade A and B recommendations reviewed with patient; age-appropriate recommendations, preventive care, screening tests, etc discussed and encouraged; healthy living encouraged; see AVS for patient education given to patient -Discussed importance of 150 minutes of physical activity weekly, eat two servings of fish weekly, eat one serving of tree nuts ( cashews, pistachios, pecans, almonds.Marland Kitchen) every other day, eat 6 servings of fruit/vegetables daily and drink plenty of water and avoid sweet  beverages.

## 2021-08-04 ENCOUNTER — Other Ambulatory Visit: Payer: Self-pay | Admitting: Emergency Medicine

## 2021-08-04 LAB — COMPLETE METABOLIC PANEL WITH GFR
AG Ratio: 2 (calc) (ref 1.0–2.5)
ALT: 31 U/L (ref 9–46)
AST: 21 U/L (ref 10–40)
Albumin: 4.6 g/dL (ref 3.6–5.1)
Alkaline phosphatase (APISO): 81 U/L (ref 36–130)
BUN: 10 mg/dL (ref 7–25)
CO2: 25 mmol/L (ref 20–32)
Calcium: 9.5 mg/dL (ref 8.6–10.3)
Chloride: 107 mmol/L (ref 98–110)
Creat: 1.1 mg/dL (ref 0.60–1.26)
Globulin: 2.3 g/dL (calc) (ref 1.9–3.7)
Glucose, Bld: 91 mg/dL (ref 65–99)
Potassium: 3.9 mmol/L (ref 3.5–5.3)
Sodium: 142 mmol/L (ref 135–146)
Total Bilirubin: 0.8 mg/dL (ref 0.2–1.2)
Total Protein: 6.9 g/dL (ref 6.1–8.1)
eGFR: 91 mL/min/{1.73_m2} (ref >=60)

## 2021-08-04 LAB — CBC WITH DIFFERENTIAL/PLATELET
Absolute Monocytes: 616 cells/uL (ref 200–950)
Basophils Absolute: 34 cells/uL (ref 0–200)
Basophils Relative: 0.3 %
Eosinophils Absolute: 202 cells/uL (ref 15–500)
Eosinophils Relative: 1.8 %
HCT: 47.8 % (ref 38.5–50.0)
Hemoglobin: 17.3 g/dL — ABNORMAL HIGH (ref 13.2–17.1)
Lymphs Abs: 3864 cells/uL (ref 850–3900)
MCH: 36.7 pg — ABNORMAL HIGH (ref 27.0–33.0)
MCHC: 36.2 g/dL — ABNORMAL HIGH (ref 32.0–36.0)
MCV: 101.3 fL — ABNORMAL HIGH (ref 80.0–100.0)
MPV: 11.2 fL (ref 7.5–12.5)
Monocytes Relative: 5.5 %
Neutro Abs: 6485 cells/uL (ref 1500–7800)
Neutrophils Relative %: 57.9 %
Platelets: 203 10*3/uL (ref 140–400)
RBC: 4.72 10*6/uL (ref 4.20–5.80)
RDW: 12.2 % (ref 11.0–15.0)
Total Lymphocyte: 34.5 %
WBC: 11.2 10*3/uL — ABNORMAL HIGH (ref 3.8–10.8)

## 2021-08-04 LAB — LIPID PANEL
Cholesterol: 148 mg/dL (ref <200)
HDL: 16 mg/dL — ABNORMAL LOW (ref >=40)
LDL Cholesterol (Calc): 100 mg/dL (calc) — ABNORMAL HIGH
Non-HDL Cholesterol (Calc): 132 mg/dL (calc) — ABNORMAL HIGH (ref <130)
Total CHOL/HDL Ratio: 9.3 (calc) — ABNORMAL HIGH (ref <5.0)
Triglycerides: 199 mg/dL — ABNORMAL HIGH (ref <150)

## 2021-08-04 LAB — TESTOSTERONE: Testosterone: 364 ng/dL (ref 250–827)

## 2021-08-04 LAB — TSH: TSH: 2.79 mIU/L (ref 0.40–4.50)

## 2021-08-04 NOTE — Progress Notes (Signed)
Epworth Sleepiness Scale ?

## 2021-08-13 ENCOUNTER — Emergency Department: Payer: Managed Care, Other (non HMO)

## 2021-08-13 ENCOUNTER — Emergency Department
Admission: EM | Admit: 2021-08-13 | Discharge: 2021-08-13 | Disposition: A | Discharge status: Home / Self Care | Payer: Managed Care, Other (non HMO) | Attending: Emergency Medicine | Admitting: Emergency Medicine

## 2021-08-13 ENCOUNTER — Other Ambulatory Visit: Payer: Self-pay

## 2021-08-13 DIAGNOSIS — S39012A Strain of muscle, fascia and tendon of lower back, initial encounter: Secondary | ICD-10-CM | POA: Insufficient documentation

## 2021-08-13 DIAGNOSIS — Y92410 Unspecified street and highway as the place of occurrence of the external cause: Secondary | ICD-10-CM | POA: Insufficient documentation

## 2021-08-13 DIAGNOSIS — S3992XA Unspecified injury of lower back, initial encounter: Secondary | ICD-10-CM | POA: Diagnosis present

## 2021-08-13 DIAGNOSIS — G44319 Acute post-traumatic headache, not intractable: Secondary | ICD-10-CM | POA: Diagnosis not present

## 2021-08-13 MED ORDER — MELOXICAM 15 MG PO TABS: 15.0000 mg | ORAL_TABLET | Freq: Every day | ORAL | 0 refills | Status: DC

## 2021-08-13 MED ORDER — METHOCARBAMOL 500 MG PO TABS: 500.0000 mg | ORAL_TABLET | Freq: Four times a day (QID) | ORAL | 0 refills | Status: DC
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
## 2021-08-17 ENCOUNTER — Ambulatory Visit: Payer: Self-pay | Admitting: Nurse Practitioner

## 2021-08-17 ENCOUNTER — Other Ambulatory Visit: Payer: Self-pay

## 2021-08-17 ENCOUNTER — Encounter: Payer: Self-pay | Admitting: Nurse Practitioner

## 2021-08-17 ENCOUNTER — Telehealth: Payer: Self-pay

## 2021-08-17 DIAGNOSIS — F419 Anxiety disorder, unspecified: Secondary | ICD-10-CM

## 2021-08-17 DIAGNOSIS — M545 Low back pain, unspecified: Secondary | ICD-10-CM

## 2021-08-17 DIAGNOSIS — G44309 Post-traumatic headache, unspecified, not intractable: Secondary | ICD-10-CM

## 2021-08-17 DIAGNOSIS — M542 Cervicalgia: Secondary | ICD-10-CM

## 2021-08-17 MED ORDER — HYDROXYZINE PAMOATE 25 MG PO CAPS: 25.0000 mg | ORAL_CAPSULE | Freq: Two times a day (BID) | ORAL | 0 refills | Status: DC | PRN

## 2021-08-17 NOTE — Telephone Encounter (Signed)
Patient notified to pick up letter or look in Mychart ?
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
## 2021-08-17 NOTE — Telephone Encounter (Signed)
Please read message from patient. He is asking for work note from yesterday and today. Was seen today

## 2021-08-21 ENCOUNTER — Ambulatory Visit (INDEPENDENT_AMBULATORY_CARE_PROVIDER_SITE_OTHER): Payer: Managed Care, Other (non HMO) | Admitting: Urology

## 2021-08-21 ENCOUNTER — Other Ambulatory Visit: Payer: Self-pay

## 2021-08-21 ENCOUNTER — Encounter: Payer: Self-pay | Admitting: Urology

## 2021-08-21 VITALS — BP 145/96 | HR 85 | Ht 68.0 in | Wt 191.0 lb

## 2021-08-21 DIAGNOSIS — Z3009 Encounter for other general counseling and advice on contraception: Secondary | ICD-10-CM

## 2021-08-21 NOTE — Progress Notes (Signed)
? ?08/21/2021 ?8:56 AM  ? ?[name] ?[birth year] ?[id number] ? ?Referring provider: Bo Merino, FNP ?[address] ?[address] ?[place],  [place] [address] ? ?Chief Complaint  ?Patient presents with  ? VAS Consult  ? ? ?HPI: ?Douglas Greer is a 31 y.o. male who presents for vasectomy counseling. ? ?Presently single with 3 children and does not desire additional children ?Denies prior history urologic problems including chronic scrotal pain, epididymitis or orchitis ?Prior laparoscopic hernia repair ~ 2012 ?No history of bleeding or clotting disorders ? ? ?PMH: ?Past Medical History:  ?Diagnosis Date  ? Colitis presumed infectious 12/24/2018  ? Elevated hemoglobin ([place]) 06/21/2017  ? High hematocrit 06/21/2017  ? Low HDL (under 40) 11/26/2015  ? Neck arthritis 09/27/2017  ? Tobacco use 11/27/2015  ? Verruca plana 11/25/2015  ? ? ?Surgical History: ?Past Surgical History:  ?Procedure Laterality Date  ? INCISIONAL HERNIA REPAIR    ? NASAL FRACTURE SURGERY    ? ? ?Home Medications:  ?Allergies as of 08/21/2021   ? ?   Reactions  ? Amoxil [amoxicillin] Rash  ? ?  ? ?  ?Medication List  ?  ? ?  ? Accurate as of August 21, 2021  8:56 AM. If you have any questions, ask your nurse or doctor.  ?  ?  ? ?  ? ?hydrOXYzine 25 MG capsule ?Commonly known as: VISTARIL ?Take 1 capsule (25 mg total) by mouth 2 (two) times daily as needed. ?  ?meloxicam 15 MG tablet ?Commonly known as: MOBIC ?Take 1 tablet (15 mg total) by mouth daily. ?  ?methocarbamol 500 MG tablet ?Commonly known as: Robaxin ?Take 1 tablet (500 mg total) by mouth 4 (four) times daily. ?  ?multivitamin Tabs tablet ?Take 1 tablet by mouth daily. ?  ? ?  ? ? ?Allergies:  ?Allergies  ?Allergen Reactions  ? Amoxil [Amoxicillin] Rash  ? ? ?Family History: ?Family History  ?Problem Relation Age of Onset  ? Hypertension Mother   ? Diabetes Father   ? Asthma Son   ? Allergies Son   ?     peanuts  ? ? ?Social History:  reports that he has been smoking cigarettes.  He has a 12.00 pack-year smoking history. He has never used smokeless tobacco. He reports current alcohol use. He reports current drug use. Drug: Marijuana. ? ? ?Physical Exam: ?BP (!) 145/96   Pulse 85   Ht 5\' 8"  (1.727 m)   Wt 191 lb (86.6 kg)   BMI 29.04 kg/m?   ?Constitutional:  Alert and oriented, No acute distress. ?HEENT: [place] AT, moist mucus membranes.  Trachea midline, no masses. ?Cardiovascular: No clubbing, cyanosis, or edema. ?Respiratory: Normal respiratory effort, no increased work of breathing. ?GI: Abdomen is soft, nontender, nondistended, no abdominal masses ?GU: Phallus without lesions, testes descended bilaterally without masses or tenderness, spermatic cord/epididymis palpably normal bilaterally.  Left vasa easily palpable.  Right vasa difficult to palpate ?Skin: No rashes, bruises or suspicious lesions. ?Neurologic: Grossly intact, no focal deficits, moving all 4 extremities. ?Psychiatric: Normal mood and affect. ? ? ?Assessment & Plan:   ? ?1.  Undesired fertility ?We had a long discussion about vasectomy. We specifically discussed the procedure, recovery and the risks, benefits and alternatives of vasectomy. I explained that the procedure entails removal of a segment of each vas deferens, each of which conducts sperm, and that the purpose of this procedure is to cause sterility (inability to produce children or cause pregnancy). Vasectomy is intended to be  permanent and irreversible form of contraception. Options for fertility after vasectomy include vasectomy reversal, or sperm retrieval with in vitro fertilization. These options are not always successful, and they may be expensive. We discussed reversible forms of birth control such as condoms, IUD or diaphragms, as well as the option of freezing sperm in a sperm bank prior to the vasectomy procedure. We discussed the importance of avoiding strenuous exercise for four days after vasectomy, and the importance of refraining from any form of  ejaculation for seven days after vasectomy. I explained that vasectomy does not produce immediate sterility so another form of contraceptive must be used until sterility is assured by having semen checked for sperm. Thus, a post vasectomy semen analysis is necessary to confirm sterility. Rarely, vasectomy must be repeated. We discussed the approximately 1 in 2,000 risk of pregnancy after vasectomy for men who have post-vasectomy semen analysis showing absent sperm or rare non-motile sperm. Typical side effects include a small amount of oozing blood, some discomfort and mild swelling in the area of incision.  Vasectomy does not affect sexual performance, function, please, sensation, interest, desire, satisfaction, penile erection, volume of semen or ejaculation. Other rare risks include allergy or adverse reaction to an anesthetic, testicular atrophy, hematoma, infection/abscess, prolonged tenderness of the vas deferens, pain, swelling, painful nodule or scar (called sperm granuloma) or epididymtis. We discussed chronic testicular pain syndrome. This has been reported to occur in as many as 1-2% of men and may be permanent. This can be treated with medication, small procedures or (rarely) surgery. ?He would like to think over before scheduling ? ? ? ?Abbie Sons, MD ? ?[facility] ?[address] ?[place],  [address] ?[phone] ? ?

## 2021-08-21 NOTE — Patient Instructions (Signed)

## 2021-08-22 ENCOUNTER — Ambulatory Visit: Payer: Self-pay | Admitting: Family Medicine

## 2021-08-25 ENCOUNTER — Telehealth: Payer: Self-pay

## 2021-08-25 NOTE — Telephone Encounter (Signed)
Copied from CRM [id number]. Topic: Referral - Request for Referral >> Aug 22, 2021  4:22 PM Randol Kern wrote: Has patient seen PCP for this complaint? Yes.   *If NO, is insurance requiring patient see PCP for this issue before PCP can refer them? Referral for which specialty: Therapy/Counseling  Preferred provider/office: [facility]  Reason for referral: Anxiety

## 2021-08-28 ENCOUNTER — Other Ambulatory Visit: Payer: Self-pay | Admitting: Nurse Practitioner

## 2021-08-28 NOTE — Telephone Encounter (Signed)
Please put in referral

## 2021-08-28 NOTE — Telephone Encounter (Signed)
Left message for patient that referral was put in ?

## 2021-09-13 ENCOUNTER — Other Ambulatory Visit: Payer: Self-pay | Admitting: Nurse Practitioner

## 2021-09-13 DIAGNOSIS — F419 Anxiety disorder, unspecified: Secondary | ICD-10-CM
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
## 2021-09-14 ENCOUNTER — Ambulatory Visit: Payer: 59 | Admitting: Psychology

## 2022-05-17 ENCOUNTER — Ambulatory Visit: Payer: Managed Care, Other (non HMO) | Admitting: Nurse Practitioner

## 2022-05-24 ENCOUNTER — Ambulatory Visit: Payer: Managed Care, Other (non HMO) | Admitting: Nurse Practitioner

## 2022-06-01 ENCOUNTER — Encounter: Payer: Self-pay | Admitting: Nurse Practitioner

## 2022-06-01 ENCOUNTER — Ambulatory Visit: Payer: Medicaid Other | Admitting: Nurse Practitioner

## 2022-06-01 ENCOUNTER — Other Ambulatory Visit: Payer: Self-pay

## 2022-06-01 VITALS — BP 118/76 | HR 88 | Temp 98.2°F | Resp 18 | Ht 68.0 in | Wt 183.5 lb

## 2022-06-01 DIAGNOSIS — F17209 Nicotine dependence, unspecified, with unspecified nicotine-induced disorders: Secondary | ICD-10-CM | POA: Diagnosis not present

## 2022-06-01 DIAGNOSIS — F331 Major depressive disorder, recurrent, moderate: Secondary | ICD-10-CM | POA: Insufficient documentation

## 2022-06-01 DIAGNOSIS — Z720 Tobacco use: Secondary | ICD-10-CM | POA: Diagnosis not present

## 2022-06-01 DIAGNOSIS — F419 Anxiety disorder, unspecified: Secondary | ICD-10-CM | POA: Insufficient documentation

## 2022-06-01 MED ORDER — VARENICLINE TARTRATE (STARTER) 0.5 MG X 11 & 1 MG X 42 PO TBPK: ORAL_TABLET | ORAL | 0 refills | Status: DC

## 2022-06-01 MED ORDER — NICOTINE 21 MG/24HR TD PT24: 21.0000 mg | MEDICATED_PATCH | Freq: Every day | TRANSDERMAL | 0 refills | Status: DC

## 2022-06-01 NOTE — Assessment & Plan Note (Signed)
Worsening anxiety and depression referral placed to social work to help find therapist.  Info provided for local therapists for him to reach out to.

## 2022-06-01 NOTE — Assessment & Plan Note (Signed)
Worsening anxiety and depression referral placed to social work to help find therapist.  Info provided for local therapists for him to reach out to.  

## 2022-06-01 NOTE — Progress Notes (Signed)
BP 118/76   Pulse 88   Temp 98.2 F (36.8 C) (Oral)   Resp 18   Ht _0  (1.727 m)   Wt 183 lb 8 oz (83.2 kg)   SpO2 94%   BMI 27.90 kg/m    Subjective:    Patient ID: Douglas Greer, male    DOB: March 05, 1991, 31 y.o.   MRN: [id number]  HPI: Douglas Greer is a 31 y.o. male  Chief Complaint  Patient presents with   Depression   Anxiety   Depression/anxiety:patient reports that his anxiety and depression has been getting worse. His PHQ9 and GAD scores are positive. Patient denies suicidal thoughts or plans to hurt himself just reports he has passive thoughts of not being here anymore.  Patient does not like to take medication and would like to speak to a therapist. We have previously tried to get him in to [place] without success.  Gave him a list of local therapists and placed referral to social work to help obtain a therapist.  Patient is agreeable to plan.     06/01/2022    2:07 PM 08/17/2021   10:52 AM 08/03/2021    8:49 AM 10/02/2019    8:43 AM 09/04/2019    8:44 AM  Depression screen PHQ 2/9  Decreased Interest 2 0 0 0 0  Down, Depressed, Hopeless 2 0 0 0 0  PHQ - 2 Score 4 0 0 0 0  Altered sleeping 3   0 0  Tired, decreased energy 1   0 0  Change in appetite 1   0 0  Feeling bad or failure about yourself  3   0 0  Trouble concentrating 2   0 0  Moving slowly or fidgety/restless 0   0 0  Suicidal thoughts 1   0 0  PHQ-9 Score 15   0 0  Difficult doing work/chores Somewhat difficult   Not difficult at all Not difficult at all       06/01/2022    2:07 PM 08/17/2021   11:12 AM  GAD 7 : Generalized Anxiety Score  Nervous, Anxious, on Edge 2 1  Control/stop worrying 3 1  Worry too much - different things 3 1  Trouble relaxing 2 1  Restless 1 1  Easily annoyed or irritable 3 0  Afraid - awful might happen 3 3  Total GAD 7 Score 17 8  Anxiety Difficulty Somewhat difficult Not difficult at all    Smoking:  would like to quit smoking he currently smokes about a  pack a day.  Discussed options and he says he has tried chantix before but did not have much success. Discussed combo of nicotine patch and chantix.  Patient is willing to try that.  Prescription sent. Patient to follow up in 4 weeks.   Relevant past medical, surgical, family and social history reviewed and updated as indicated. Interim medical history since our last visit reviewed. Allergies and medications reviewed and updated.  Review of Systems  Constitutional: Negative for fever or weight change.  Respiratory: Negative for cough and shortness of breath.   Cardiovascular: Negative for chest pain or palpitations.  Gastrointestinal: Negative for abdominal pain, no bowel changes.  Musculoskeletal: Negative for gait problem or joint swelling.  Skin: Negative for rash.  Neurological: Negative for dizziness or headache.  No other specific complaints in a complete review of systems (except as listed in HPI above).      Objective:    BP 118/76  Pulse 88   Temp 98.2 F (36.8 C) (Oral)   Resp 18   Ht _0  (1.727 m)   Wt 183 lb 8 oz (83.2 kg)   SpO2 94%   BMI 27.90 kg/m   Wt Readings from Last 3 Encounters:  06/01/22 183 lb 8 oz (83.2 kg)  08/21/21 191 lb (86.6 kg)  08/17/21 191 lb 3.2 oz (86.7 kg)    Physical Exam  Constitutional: Patient appears well-developed and well-nourished. No distress.  HEENT: head atraumatic, normocephalic, pupils equal and reactive to light, neck supple Cardiovascular: Normal rate, regular rhythm and normal heart sounds.  No murmur heard. No BLE edema. Pulmonary/Chest: Effort normal and breath sounds normal. No respiratory distress. Abdominal: Soft.  There is no tenderness. Psychiatric: Patient has a normal mood and affect. behavior is normal. Judgment and thought content normal.  Results for orders placed or performed in visit on 08/03/21  Lipid panel  Result Value Ref Range   Cholesterol 148 <200 mg/dL   HDL 16 (L) > OR = 40 mg/dL    Triglycerides 199 (H) <150 mg/dL   LDL Cholesterol (Calc) 100 (H) mg/dL (calc)   Total CHOL/HDL Ratio 9.3 (H) <5.0 (calc)   Non-HDL Cholesterol (Calc) 132 (H) <130 mg/dL (calc)  CBC with Differential/Platelet  Result Value Ref Range   WBC 11.2 (H) 3.8 - 10.8 Thousand/uL   RBC 4.72 4.20 - 5.80 Million/uL   Hemoglobin 17.3 (H) 13.2 - 17.1 g/dL   HCT 47.8 38.5 - 50.0 %   MCV 101.3 (H) 80.0 - 100.0 fL   MCH 36.7 (H) 27.0 - 33.0 pg   MCHC 36.2 (H) 32.0 - 36.0 g/dL   RDW 12.2 11.0 - 15.0 %   Platelets 203 140 - 400 Thousand/uL   MPV 11.2 7.5 - 12.5 fL   Neutro Abs 6,485 1,500 - 7,800 cells/uL   Lymphs Abs 3,864 850 - 3,900 cells/uL   Absolute Monocytes 616 200 - 950 cells/uL   Eosinophils Absolute 202 15 - 500 cells/uL   Basophils Absolute 34 0 - 200 cells/uL   Neutrophils Relative % 57.9 %   Total Lymphocyte 34.5 %   Monocytes Relative 5.5 %   Eosinophils Relative 1.8 %   Basophils Relative 0.3 %  COMPLETE METABOLIC PANEL WITH GFR  Result Value Ref Range   Glucose, Bld 91 65 - 99 mg/dL   BUN 10 7 - 25 mg/dL   Creat 1.10 0.60 - 1.26 mg/dL   eGFR 91 > OR = 60 mL/min/1.82m   BUN/Creatinine Ratio NOT APPLICABLE 6 - 22 (calc)   Sodium 142 135 - 146 mmol/L   Potassium 3.9 3.5 - 5.3 mmol/L   Chloride 107 98 - 110 mmol/L   CO2 25 20 - 32 mmol/L   Calcium 9.5 8.6 - 10.3 mg/dL   Total Protein 6.9 6.1 - 8.1 g/dL   Albumin 4.6 3.6 - 5.1 g/dL   Globulin 2.3 1.9 - 3.7 g/dL (calc)   AG Ratio 2.0 1.0 - 2.5 (calc)   Total Bilirubin 0.8 0.2 - 1.2 mg/dL   Alkaline phosphatase (APISO) 81 36 - 130 U/L   AST 21 10 - 40 U/L   ALT 31 9 - 46 U/L  Testosterone  Result Value Ref Range   Testosterone 364 250 - 827 ng/dL  TSH  Result Value Ref Range   TSH 2.79 0.40 - 4.50 mIU/L      Assessment & Plan:   Problem List Items Addressed This Visit  Other   Tobacco use (Chronic)    Smoking cessation instructions and counseling given.  Patient is wanting to quit.  He is going to start  chantix starter pack and nicotine patch.  Will follow up in 4 weeks.       Relevant Medications   Varenicline Tartrate, Starter, (CHANTIX STARTING MONTH PAK) 0.5 MG X 11 & 1 MG X 42 TBPK   nicotine (NICODERM CQ - DOSED IN MG/24 HOURS) 21 mg/24hr patch   Moderate episode of recurrent major depressive disorder ([place]) - Primary    Worsening anxiety and depression referral placed to social work to help find therapist.  Info provided for local therapists for him to reach out to.       Relevant Orders   AMB Referral to [facility] ([facility] Patients)   Anxiety    Worsening anxiety and depression referral placed to social work to help find therapist.  Info provided for local therapists for him to reach out to.       Relevant Orders   AMB Referral to Community Care Coordinaton ([facility] Patients)   Other Visit Diagnoses     Tobacco use disorder, continuous       Relevant Medications   Varenicline Tartrate, Starter, (CHANTIX STARTING MONTH PAK) 0.5 MG X 11 & 1 MG X 42 TBPK   nicotine (NICODERM CQ - DOSED IN MG/24 HOURS) 21 mg/24hr patch        Follow up plan: Return in about 4 weeks (around 06/29/2022) for follow up.

## 2022-06-01 NOTE — Assessment & Plan Note (Signed)
Smoking cessation instructions and counseling given.  Patient is wanting to quit.  He is going to start chantix starter pack and nicotine patch.  Will follow up in 4 weeks.

## 2022-06-07 ENCOUNTER — Ambulatory Visit: Payer: Managed Care, Other (non HMO) | Admitting: Nurse Practitioner

## 2022-06-13 ENCOUNTER — Other Ambulatory Visit: Payer: Medicaid Other | Admitting: Licensed Clinical Social Worker

## 2022-06-13 DIAGNOSIS — F419 Anxiety disorder, unspecified: Secondary | ICD-10-CM

## 2022-06-13 DIAGNOSIS — F331 Major depressive disorder, recurrent, moderate: Secondary | ICD-10-CM

## 2022-06-14 NOTE — Patient Outreach (Signed)
Medicaid Managed Care Social Work Note  06/14/2022 Name:  Fitzhugh Vizcarrondo MRN:  [id number] DOB:  05/14/1991  Tamotsu Wiederholt is an 32 y.o. year old male who is a primary patient of Bo Merino, FNP.  The Medicaid Managed Care Coordination team was consulted for assistance with:  Mental Health Counseling and Resources  Mr. Burdette was given information about Medicaid Managed Care Coordination team services today. [name] Patient agreed to services and verbal consent obtained.  Engaged with patient  for by telephone forinitial visit in response to referral for case management and/or care coordination services.   Assessments/Interventions:  Review of past medical history, allergies, medications, health status, including review of consultants reports, laboratory and other test data, was performed as part of comprehensive evaluation and provision of chronic care management services.  SDOH: (Social Determinant of Health) assessments and interventions performed: SDOH Interventions    Flowsheet Row Patient Outreach Telephone from 06/13/2022 in [place] Office Visit from 06/01/2022 in [facility] Office Visit from 08/03/2021 in [facility]  SDOH Interventions     Food Insecurity Interventions -- -- Intervention Not Indicated  Housing Interventions Intervention Not Indicated -- Intervention Not Indicated  Transportation Interventions Intervention Not Indicated -- Intervention Not Indicated  Depression Interventions/Treatment  Counseling Referral to Psychiatry --  Financial Strain Interventions -- -- Intervention Not Indicated  Physical Activity Interventions -- -- Other (Comments)  [discussed increasing phyiscal activity]  Stress Interventions -- -- Intervention Not Indicated  Social Connections Interventions -- -- Intervention Not Indicated       Advanced Directives Status:  See Care Plan for related  entries.  Care Plan                 Allergies  Allergen Reactions   Amoxil [Amoxicillin] Rash    Medications Reviewed Today     Reviewed by Greg Cutter, LCSW (Social Worker) on 06/13/22 at 74  Med List Status: <None>   Medication Order Taking? Sig Documenting Provider Last Dose Status Informant  multivitamin (ONE-A-DAY MEN'S) TABS tablet [id number] No Take 1 tablet by mouth daily. [provider] Taking Active   nicotine (NICODERM CQ - DOSED IN MG/24 HOURS) 21 mg/24hr patch [id number]  Place 1 patch (21 mg total) onto the skin daily. Bo Merino, FNP  Active   Varenicline Tartrate, Starter, (CHANTIX STARTING MONTH PAK) 0.5 MG X 11 & 1 MG X 42 TBPK [id number]  Take 0.5 mg tablet by mouth 1x daily for 3 days, then increase to 0.5 mg tablet 2x daily for 4 days, then increase to 1 mg tablet 2x daily. Bo Merino, FNP  Active             Patient Active Problem List   Diagnosis Date Noted   Moderate episode of recurrent major depressive disorder ([place]) 06/01/2022   Anxiety 06/01/2022   Mixed hyperlipidemia 09/04/2019   Spontaneous pneumothorax 08/25/2019   Tobacco dependence 08/25/2019   Elevated serum glutamic pyruvic transaminase (SGPT) level 08/12/2018   Spondylosis without myelopathy or radiculopathy, cervical region 04/10/2018   Overweight 01/24/2018   Genital warts 10/04/2016   Tobacco use 11/27/2015   Total bilirubin, elevated 11/26/2015   Low HDL (under 40) 11/26/2015    Conditions to be addressed/monitored per PCP order:  Anxiety and Depression  Care Plan : LCSW Plan of Care  Updates made by Greg Cutter, LCSW since 06/14/2022 12:00 AM     Problem: Coping Skills (General Plan of  Care)      Long-Range Goal: Coping Skills Enhanced   Start Date: 06/13/2022  Note:   Priority: High  Timeframe:  Long-Range Goal Priority:  High Start Date:   06/13/22        Expected End Date:  ongoing                     Follow Up Date- 06/27/22 at 230 pm  -  keep 90 percent of scheduled appointments -consider counseling or psychiatry -consider bumping up your self-care  -consider creating a stronger support network   Why is this important?             Combatting depression may take some time.            If you don't feel better right away, don't give up on your treatment plan.    Current barriers:   Chronic Mental Health needs related to depression, stress and anxiety. Patient requires Support, Education, Resources, Referrals, Advocacy, and Care Coordination, in order to meet Unmet Mental Health Needs. Patient will implement clinical interventions discussed today to decrease symptoms of depression and increase knowledge and/or ability of: coping skills. Mental Health Concerns and Social Isolation Patient lacks knowledge of available community counseling agencies and resources.  Clinical Goal(s): verbalize understanding of plan for management of Anxiety, Depression, and Stress and demonstrate a reduction in symptoms. Patient will connect with a provider for ongoing mental health treatment, increase coping skills, healthy habits, self-management skills, and stress reduction        Clinical Interventions:  Assessed patient's previous and current treatment, coping skills, support system and barriers to care. Patient provided hx  Patient and his mother and 70 yo son live together. Patient has a younger daughter as well but she does not reside with patient.  Verbalization of feelings encouraged, motivational interviewing employed Emotional support provided, positive coping strategies explored. Establishing healthy boundaries emphasized and healthy self-care education provided Patient was educated on available mental health resources within their area that accept Medicaid and offer counseling and psychiatry. Patient is agreeable to referral to [facility] for counseling. Referral made on 06/14/22 and email sent to patient.  Email sent to patient today with  instructions for scheduling at [facility] as well as some crisis support resources and [facility]'s walk in clinic hours Patient reports no needs with transportation or housing. Patient receives strong support from mother. LCSW provided education on relaxation techniques such as meditation, deep breathing, massage, grounding exercises or yoga that can activate the body's relaxation response and ease symptoms of stress and anxiety. LCSW ask that when pt is struggling with difficult emotions and racing thoughts that they start this relaxation response process. LCSW provided extensive education on healthy coping skills for anxiety. SW used active and reflective listening, validated patient's feelings/concerns, and provided emotional support. Patient will work on implementing appropriate self-care habits into their daily routine such as: staying positive, writing a gratitude list, drinking water, staying active around the house, taking their medications as prescribed, combating negative thoughts or emotions and staying connected with their family and friends. Positive reinforcement provided for this decision to work on this. LCSW provided education on healthy sleep hygiene and what that looks like. LCSW encouraged patient to implement a night time routine into their schedule that works best for them and that they are able to maintain. Advised patient to implement deep breathing/grounding/meditation/self-care exercises into their nightly routine to combat racing thoughts at night. LCSW encouraged patient to wake up  at the same time each day, make their sleeping environment comfortable, exercise when able, to limit naps and to not eat or drink anything right before bed.  Motivational Interviewing employed Depression screen reviewed  PHQ2/ PHQ9 completed or reviewed  Mindfulness or Relaxation training provided Active listening / Reflection utilized  Advance Care and HCPOA education provided Emotional Support  Provided Problem Solving /Task Center strategies reviewed Provided psychoeducation for mental health needs  Provided brief CBT  Reviewed mental health medications and discussed importance of compliance:  Quality of sleep assessed & Sleep Hygiene techniques promoted  Participation in counseling encouraged  Verbalization of feelings encouraged  Suicidal Ideation/Homicidal Ideation assessed: Patient denies SI/HI  Review resources, discussed options and provided patient information about  Mental Health Resources Inter-disciplinary care team collaboration (see longitudinal plan of care) Patient Goals/Self-Care Activities: Over the next 120 days Attend scheduled medical appointments Utilize healthy coping skills and supportive resources discussed Contact PCP with any questions or concerns Keep 90 percent of counseling appointments Call your insurance provider for more information about your Enhanced Benefits  Check out counseling resources provided  Begin personal counseling with LCSW, to reduce and manage symptoms of Depression and Stress, until well-established with mental health provider Accept all calls from representative with [employer] in an effort to establish ongoing mental health counseling and supportive services. Incorporate into daily practice - relaxation techniques, deep breathing exercises, and mindfulness meditation strategies. Talk about feelings with friends, family members, spiritual advisor, etc. Contact LCSW directly [phone]), if you have questions, need assistance, or if additional social work needs are identified between now and our next scheduled telephone outreach call. Call 988 for mental health hotline/crisis line if needed (24/7 available) Try techniques to reduce symptoms of anxiety/negative thinking (deep breathing, distraction, positive self talk, etc)  - develop a personal safety plan - develop a plan to deal with triggers like holidays, anniversaries -  exercise at least 2 to 3 times per week - have a plan for how to handle bad days - journal feelings and what helps to feel better or worse - spend time or talk with others at least 2 to 3 times per week - watch for early signs of feeling worse - begin personal counseling - call and visit an old friend - check out volunteer opportunities - join a support group - laugh; watch a funny movie or comedian - learn and use visualization or guided imagery - perform a random act of kindness - practice relaxation or meditation daily - start or continue a personal journal - practice positive thinking and self-talk -continue with compliance of taking medication  -identify current effective and ineffective coping strategies.  -implement positive self-talk in care to increase self-esteem, confidence and feelings of control.  -consider alternative and complementary therapy approaches such as meditation, mindfulness or yoga.  -journaling, prayer, worship services, meditation or pastoral counseling.  -increase participation in pleasurable group activities such as hobbies, singing, sports or volunteering).  -consider the use of meditative movement therapy such as tai chi, yoga or qigong.  -start a regular daily exercise program based on tolerance, ability and patient choice to support positive thinking and activity      24- Hour Availability:    [facility]  [address] [place], [place] [phone] Crisis [phone]   [facility] [phone]   [facility]  [phone]    [facility]  [phone] (after hours)   Therapeutic  Alternative/Mobile Crisis   [phone]   [place] National Suicide Hotline  2083159333 (TALK) OR [phone]   Call 911 or go to emergency room   [facility]  [phone]);  [place] and [employer]  [phone]); [place], [place],  [place], [place], [place], [place], [place]          If you are experiencing a Mental Health or [place] or need someone to talk to, please call the Suicide and Crisis Lifeline: 988    Patient Goals: Initial goal     Follow up:  Patient agrees to Care Plan and Follow-up.  Plan: The Managed Medicaid care management team will reach out to the patient again over the next 30 days.  Date/time of next scheduled Social Work care management/care coordination outreach:  06/27/22 at 230 pm  Eula Fried, [place], MSW, [place] Medicaid LCSW [email or web address] Phone: [phone]

## 2022-06-14 NOTE — Patient Instructions (Signed)
Visit Information  Mr. Linnemann was given information about Medicaid Managed Care team care coordination services as a part of their [place] Medicaid benefit. [name] verbally consented to engagement with the [facility] Managed Care team.   If you are experiencing a medical emergency, please call 911 or report to your local emergency department or urgent care.   If you have a non-emergency medical problem during routine business hours, please contact your provider's office and ask to speak with a nurse.   For questions related to your [facility], please call: [phone] or visit the homepage here: [email or web address]  If you would like to schedule transportation through your [facility], please call the following number at least 2 days in advance of your appointment: [phone]   Rides for urgent appointments can also be made after hours by calling Member Services.  Call the [place] at [phone], at any time, 24 hours a day, 7 days a week. If you are in danger or need immediate medical attention call 911.  If you would like help to quit smoking, call 1-800-QUIT-NOW 7084915603) OR Espaol: 1-855-Djelo-Ya (9-485-462-7035) o para ms informacin haga clic aqu or Text READY to 200-400 to register via text   Following is a copy of your plan of care:  Care Plan : LCSW Plan of Care  Updates made by Greg Cutter, LCSW since 06/14/2022 12:00 AM     Problem: Coping Skills (General Plan of Care)      Long-Range Goal: Coping Skills Enhanced   Start Date: 06/13/2022  Note:   Priority: High  Timeframe:  Long-Range Goal Priority:  High Start Date:   06/13/22        Expected End Date:  ongoing                     Follow Up Date- 06/27/22 at 230 pm  - keep 90 percent of scheduled appointments -consider counseling or  psychiatry -consider bumping up your self-care  -consider creating a stronger support network   Why is this important?             Combatting depression may take some time.            If you don't feel better right away, don't give up on your treatment plan.    Current barriers:   Chronic Mental Health needs related to depression, stress and anxiety. Patient requires Support, Education, Resources, Referrals, Advocacy, and Care Coordination, in order to meet Unmet Mental Health Needs. Patient will implement clinical interventions discussed today to decrease symptoms of depression and increase knowledge and/or ability of: coping skills. Mental Health Concerns and Social Isolation Patient lacks knowledge of available community counseling agencies and resources.  Clinical Goal(s): verbalize understanding of plan for management of Anxiety, Depression, and Stress and demonstrate a reduction in symptoms. Patient will connect with a provider for ongoing mental health treatment, increase coping skills, healthy habits, self-management skills, and stress reduction        Patient Goals/Self-Care Activities: Over the next 120 days Attend scheduled medical appointments Utilize healthy coping skills and supportive resources discussed Contact PCP with any questions or concerns Keep 90 percent of counseling appointments Call your insurance provider for more information about your Enhanced Benefits  Check out counseling resources provided  Begin personal counseling with LCSW, to reduce and manage symptoms of Depression and Stress, until well-established with mental health provider Accept all calls  from representative with [employer] in an effort to establish ongoing mental health counseling and supportive services. Incorporate into daily practice - relaxation techniques, deep breathing exercises, and mindfulness meditation strategies. Talk about feelings with friends, family members, spiritual advisor,  etc. Contact LCSW directly [phone]), if you have questions, need assistance, or if additional social work needs are identified between now and our next scheduled telephone outreach call. Call 988 for mental health hotline/crisis line if needed (24/7 available) Try techniques to reduce symptoms of anxiety/negative thinking (deep breathing, distraction, positive self talk, etc)  - develop a personal safety plan - develop a plan to deal with triggers like holidays, anniversaries - exercise at least 2 to 3 times per week - have a plan for how to handle bad days - journal feelings and what helps to feel better or worse - spend time or talk with others at least 2 to 3 times per week - watch for early signs of feeling worse - begin personal counseling - call and visit an old friend - check out volunteer opportunities - join a support group - laugh; watch a funny movie or comedian - learn and use visualization or guided imagery - perform a random act of kindness - practice relaxation or meditation daily - start or continue a personal journal - practice positive thinking and self-talk -continue with compliance of taking medication  -identify current effective and ineffective coping strategies.  -implement positive self-talk in care to increase self-esteem, confidence and feelings of control.  -consider alternative and complementary therapy approaches such as meditation, mindfulness or yoga.  -journaling, prayer, worship services, meditation or pastoral counseling.  -increase participation in pleasurable group activities such as hobbies, singing, sports or volunteering).  -consider the use of meditative movement therapy such as tai chi, yoga or qigong.  -start a regular daily exercise program based on tolerance, ability and patient choice to support positive thinking and activity      24- Hour Availability:    [facility]  [address] [place], [place] Crisis [phone]   Family Service of the [employer] Center Line  [phone]    [place]  [phone] (after hours)   Therapeutic Alternative/Mobile Crisis   [phone]   [place] National Suicide Hotline  (360) 216-4641 (TALK) OR 988   Call 911 or go to emergency room   [facility]  [phone]);  [place] and [employer]  [phone]); [place], [place], [place], [place], [place], [place], [place]          If you are experiencing a Mental Health or [place] or need someone to talk to, please call the Suicide and Crisis Lifeline: 988    Patient Goals: Initial goal

## 2022-06-27 ENCOUNTER — Other Ambulatory Visit: Payer: Medicaid Other | Admitting: Licensed Clinical Social Worker

## 2022-06-27 NOTE — Patient Outreach (Signed)
  Medicaid Managed Care   Unsuccessful Attempt Note   06/27/2022 Name: Douglas Greer MRN: [id number] DOB: 09-06-1990  Referred by: Bo Merino, FNP Reason for referral : No chief complaint on file.   An unsuccessful telephone outreach was attempted today. The patient was referred to the case management team for assistance with care management and care coordination.  [facility] LCSW was unable to connect with patient during scheduled appointment time today and was unable to answer his return call. [facility] LCSW will return his call in two days to follow up.   Follow Up Plan: The Managed Medicaid care management team will reach out to the patient again over the next 2 days.   Eula Fried, BSW, MSW, [employer] Managed Medicaid LCSW [email or web address] Phone: [phone]

## 2022-06-29 ENCOUNTER — Other Ambulatory Visit: Payer: Medicaid Other | Admitting: Licensed Clinical Social Worker

## 2022-06-29 ENCOUNTER — Ambulatory Visit: Payer: Medicaid Other | Admitting: Nurse Practitioner

## 2022-06-30 NOTE — Patient Outreach (Signed)
Medicaid Managed Care Social Work Note  06/29/2022 Name:  Douglas Greer MRN:  [id number] DOB:  07-29-1990  Douglas Greer is an 32 y.o. year old male who is a primary patient of Bo Merino, FNP.  The Medicaid Managed Care Coordination team was consulted for assistance with:  Mental Health Counseling and Resources  Douglas Greer was given information about Medicaid Managed Care Coordination team services today. Douglas Greer Patient agreed to services and verbal consent obtained.  Engaged with patient  for by telephone forfollow up visit in response to referral for case management and/or care coordination services.   Assessments/Interventions:  Review of past medical history, allergies, medications, health status, including review of consultants reports, laboratory and other test data, was performed as part of comprehensive evaluation and provision of chronic care management services.  SDOH: (Social Determinant of Health) assessments and interventions performed: SDOH Interventions    Flowsheet Row Patient Outreach Telephone from 06/13/2022 in [place] Office Visit from 06/01/2022 in [facility] Office Visit from 08/03/2021 in [facility]  SDOH Interventions     Food Insecurity Interventions -- -- Intervention Not Indicated  Housing Interventions Intervention Not Indicated -- Intervention Not Indicated  Transportation Interventions Intervention Not Indicated -- Intervention Not Indicated  Depression Interventions/Treatment  Counseling Referral to Psychiatry --  Financial Strain Interventions -- -- Intervention Not Indicated  Physical Activity Interventions -- -- Other (Comments)  [discussed increasing phyiscal activity]  Stress Interventions -- -- Intervention Not Indicated  Social Connections Interventions -- -- Intervention Not Indicated       Advanced Directives Status:  See Care Plan  for related entries.  Care Plan                 Allergies  Allergen Reactions   Amoxil [Amoxicillin] Rash    Medications Reviewed Today     Reviewed by Greg Cutter, LCSW (Social Worker) on 06/13/22 at 65  Med List Status: <None>   Medication Order Taking? Sig Documenting Provider Last Dose Status Informant  multivitamin (ONE-A-DAY MEN'S) TABS tablet [id number] No Take 1 tablet by mouth daily. [provider] Taking Active   nicotine (NICODERM CQ - DOSED IN MG/24 HOURS) 21 mg/24hr patch [id number]  Place 1 patch (21 mg total) onto the skin daily. Bo Merino, FNP  Active   Varenicline Tartrate, Starter, (CHANTIX STARTING MONTH PAK) 0.5 MG X 11 & 1 MG X 42 TBPK [id number]  Take 0.5 mg tablet by mouth 1x daily for 3 days, then increase to 0.5 mg tablet 2x daily for 4 days, then increase to 1 mg tablet 2x daily. Bo Merino, FNP  Active             Patient Active Problem List   Diagnosis Date Noted   Moderate episode of recurrent major depressive disorder ([place]) 06/01/2022   Anxiety 06/01/2022   Mixed hyperlipidemia 09/04/2019   Spontaneous pneumothorax 08/25/2019   Tobacco dependence 08/25/2019   Elevated serum glutamic pyruvic transaminase (SGPT) level 08/12/2018   Spondylosis without myelopathy or radiculopathy, cervical region 04/10/2018   Overweight 01/24/2018   Genital warts 10/04/2016   Tobacco use 11/27/2015   Total bilirubin, elevated 11/26/2015   Low HDL (under 40) 11/26/2015    Conditions to be addressed/monitored per PCP order:   Stress Management  Care Plan : LCSW Plan of Care  Updates made by Greg Cutter, LCSW since 06/30/2022 12:00 AM     Problem: Coping Skills (  General Plan of Care)      Long-Range Goal: Coping Skills Enhanced   Start Date: 06/13/2022  Note:   Priority: High  Timeframe:  Long-Range Goal Priority:  High Start Date:   06/13/22        Expected End Date:  ongoing                     Follow Up Date- 07/12/22 at 9  am  - keep 90 percent of scheduled appointments -consider counseling or psychiatry -consider bumping up your self-care  -consider creating a stronger support network   Why is this important?             Combatting depression may take some time.            If you don't feel better right away, don't give up on your treatment plan.    Current barriers:   Chronic Mental Health needs related to depression, stress and anxiety. Patient requires Support, Education, Resources, Referrals, Advocacy, and Care Coordination, in order to meet Unmet Mental Health Needs. Patient will implement clinical interventions discussed today to decrease symptoms of depression and increase knowledge and/or ability of: coping skills. Mental Health Concerns and Social Isolation Patient lacks knowledge of available community counseling agencies and resources.  Clinical Goal(s): verbalize understanding of plan for management of Anxiety, Depression, and Stress and demonstrate a reduction in symptoms. Patient will connect with a provider for ongoing mental health treatment, increase coping skills, healthy habits, self-management skills, and stress reduction        Clinical Interventions:  Assessed patient's previous and current treatment, coping skills, support system and barriers to care. Patient provided hx  Patient and his mother and 16 yo son live together. Patient has a younger daughter as well but she does not reside with patient.  Verbalization of feelings encouraged, motivational interviewing employed Emotional support provided, positive coping strategies explored. Establishing healthy boundaries emphasized and healthy self-care education provided Patient was educated on available mental health resources within their area that accept Medicaid and offer counseling and psychiatry. Patient is agreeable to referral to [facility] for counseling. Referral made on 06/14/22 and email sent to patient.  Email sent to patient today  with instructions for scheduling at [facility] as well as some crisis support resources and [facility]'s walk in clinic hours Patient reports no needs with transportation or housing. Patient receives strong support from mother. LCSW provided education on relaxation techniques such as meditation, deep breathing, massage, grounding exercises or yoga that can activate the body's relaxation response and ease symptoms of stress and anxiety. LCSW ask that when pt is struggling with difficult emotions and racing thoughts that they start this relaxation response process. LCSW provided extensive education on healthy coping skills for anxiety. SW used active and reflective listening, validated patient's feelings/concerns, and provided emotional support. Patient will work on implementing appropriate self-care habits into their daily routine such as: staying positive, writing a gratitude list, drinking water, staying active around the house, taking their medications as prescribed, combating negative thoughts or emotions and staying connected with their family and friends. Positive reinforcement provided for this decision to work on this. LCSW provided education on healthy sleep hygiene and what that looks like. LCSW encouraged patient to implement a night time routine into their schedule that works best for them and that they are able to maintain. Advised patient to implement deep breathing/grounding/meditation/self-care exercises into their nightly routine to combat racing thoughts at night. LCSW encouraged patient  to wake up at the same time each day, make their sleeping environment comfortable, exercise when able, to limit naps and to not eat or drink anything right before bed.  Motivational Interviewing employed Depression screen reviewed  PHQ2/ PHQ9 completed or reviewed  Mindfulness or Relaxation training provided Active listening / Reflection utilized  Advance Care and HCPOA education provided Emotional Support  Provided Problem Solving /Task Center strategies reviewed Provided psychoeducation for mental health needs  Provided brief CBT  Reviewed mental health medications and discussed importance of compliance:  Quality of sleep assessed & Sleep Hygiene techniques promoted  Participation in counseling encouraged  Verbalization of feelings encouraged  Suicidal Ideation/Homicidal Ideation assessed: Patient denies SI/HI  Review resources, discussed options and provided patient information about  Mental Health Resources Inter-disciplinary care team collaboration (see longitudinal plan of care) [facility] LCSW 06/29/22 update- Patient reports that he has not heard from [employer] yet but will contact them within the next week. Emotional support and self care education provided.  Patient Goals/Self-Care Activities: Over the next 120 days Attend scheduled medical appointments Utilize healthy coping skills and supportive resources discussed Contact PCP with any questions or concerns Keep 90 percent of counseling appointments Call your insurance provider for more information about your Enhanced Benefits  Check out counseling resources provided  Begin personal counseling with LCSW, to reduce and manage symptoms of Depression and Stress, until well-established with mental health provider Accept all calls from representative with [employer] in an effort to establish ongoing mental health counseling and supportive services. Incorporate into daily practice - relaxation techniques, deep breathing exercises, and mindfulness meditation strategies. Talk about feelings with friends, family members, spiritual advisor, etc. Contact LCSW directly [phone]), if you have questions, need assistance, or if additional social work needs are identified between now and our next scheduled telephone outreach call. Call 988 for mental health hotline/crisis line if needed (24/7 available) Try techniques to reduce symptoms of anxiety/negative  thinking (deep breathing, distraction, positive self talk, etc)  - develop a personal safety plan - develop a plan to deal with triggers like holidays, anniversaries - exercise at least 2 to 3 times per week - have a plan for how to handle bad days - journal feelings and what helps to feel better or worse - spend time or talk with others at least 2 to 3 times per week - watch for early signs of feeling worse - begin personal counseling - call and visit an old friend - check out volunteer opportunities - join a support group - laugh; watch a funny movie or comedian - learn and use visualization or guided imagery - perform a random act of kindness - practice relaxation or meditation daily - start or continue a personal journal - practice positive thinking and self-talk -continue with compliance of taking medication  -identify current effective and ineffective coping strategies.  -implement positive self-talk in care to increase self-esteem, confidence and feelings of control.  -consider alternative and complementary therapy approaches such as meditation, mindfulness or yoga.  -journaling, prayer, worship services, meditation or pastoral counseling.  -increase participation in pleasurable group activities such as hobbies, singing, sports or volunteering).  -consider the use of meditative movement therapy such as tai chi, yoga or qigong.  -start a regular daily exercise program based on tolerance, ability and patient choice to support positive thinking and activity      24- Hour Availability:    [facility]  [address] [place], [place] [employer] [phone] Crisis [phone]  Family Service [facility] the [employer] [place]    [place]  [phone] (after hours)   Therapeutic Alternative/Mobile Crisis   [phone]   [place] National Suicide Hotline  318 047 0203 [place]) [place]  988   Call 911 or go to emergency room   [facility]  [phone]);  [place] and [employer]  [phone]); [place], [place], [place], [place], [place], [place], [place]          If you are experiencing a Mental Health or [place] or need someone to talk to, please call the Suicide and Crisis Lifeline: 988    Patient Goals:Follow up goal     Follow up:  Patient agrees to Care Plan and Follow-up.  Plan: The Managed Medicaid care management team will reach out to the patient again over the next 30 days.  Date/time of next scheduled Social Work care management/care coordination outreach:  07/12/22 at 9 am   Eula Fried, BSW, MSW, [place] Medicaid LCSW [email or web address] Phone: [phone]

## 2022-06-30 NOTE — Patient Instructions (Signed)
Visit Information  Douglas Greer was given information about Medicaid Managed Care team care coordination services as a part of their [place] Medicaid benefit. Douglas Greer verbally consented to engagement with the [facility] Managed Care team.   If you are experiencing a medical emergency, please call 911 or report to your local emergency department or urgent care.   If you have a non-emergency medical problem during routine business hours, please contact your provider's office and ask to speak with a nurse.   For questions related to your [facility], please call: [phone] or visit the homepage here: [email or web address]  If you would like to schedule transportation through your [facility], please call the following number at least 2 days in advance of your appointment: [phone]   Rides for urgent appointments can also be made after hours by calling Member Services.  Call the [place] at [phone], at any time, 24 hours a day, 7 days a week. If you are in danger or need immediate medical attention call 911.  If you would like help to quit smoking, call 1-800-QUIT-NOW (820)766-1389) OR Espaol: 1-855-Djelo-Ya (3-810-175-1025) o para ms informacin haga clic aqu or Text READY to 200-400 to register via text  Following is a copy of your plan of care:  Care Plan : LCSW Plan of Care  Updates made by Greg Cutter, LCSW since 06/30/2022 12:00 AM     Problem: Coping Skills (General Plan of Care)      Long-Range Goal: Coping Skills Enhanced   Start Date: 06/13/2022  Note:   Priority: High  Timeframe:  Long-Range Goal Priority:  High Start Date:   06/13/22        Expected End Date:  ongoing                     Follow Up Date- 07/12/22 at 9 am  - keep 90 percent of scheduled appointments -consider counseling or  psychiatry -consider bumping up your self-care  -consider creating a stronger support network   Why is this important?             Combatting depression may take some time.            If you don't feel better right away, don't give up on your treatment plan.    Current barriers:   Chronic Mental Health needs related to depression, stress and anxiety. Patient requires Support, Education, Resources, Referrals, Advocacy, and Care Coordination, in order to meet Unmet Mental Health Needs. Patient will implement clinical interventions discussed today to decrease symptoms of depression and increase knowledge and/or ability of: coping skills. Mental Health Concerns and Social Isolation Patient lacks knowledge of available community counseling agencies and resources.  Clinical Goal(s): verbalize understanding of plan for management of Anxiety, Depression, and Stress and demonstrate a reduction in symptoms. Patient will connect with a provider for ongoing mental health treatment, increase coping skills, healthy habits, self-management skills, and stress reduction        Patient Goals/Self-Care Activities: Over the next 120 days Attend scheduled medical appointments Utilize healthy coping skills and supportive resources discussed Contact PCP with any questions or concerns Keep 90 percent of counseling appointments Call your insurance provider for more information about your Enhanced Benefits  Check out counseling resources provided  Begin personal counseling with LCSW, to reduce and manage symptoms of Depression and Stress, until well-established with mental health provider Accept all calls from  representative with [employer] in an effort to establish ongoing mental health counseling and supportive services. Incorporate into daily practice - relaxation techniques, deep breathing exercises, and mindfulness meditation strategies. Talk about feelings with friends, family members, spiritual advisor,  etc. Contact LCSW directly [phone]), if you have questions, need assistance, or if additional social work needs are identified between now and our next scheduled telephone outreach call. Call 988 for mental health hotline/crisis line if needed (24/7 available) Try techniques to reduce symptoms of anxiety/negative thinking (deep breathing, distraction, positive self talk, etc)  - develop a personal safety plan - develop a plan to deal with triggers like holidays, anniversaries - exercise at least 2 to 3 times per week - have a plan for how to handle bad days - journal feelings and what helps to feel better or worse - spend time or talk with others at least 2 to 3 times per week - watch for early signs of feeling worse - begin personal counseling - call and visit an old friend - check out volunteer opportunities - join a support group - laugh; watch a funny movie or comedian - learn and use visualization or guided imagery - perform a random act of kindness - practice relaxation or meditation daily - start or continue a personal journal - practice positive thinking and self-talk -continue with compliance of taking medication  -identify current effective and ineffective coping strategies.  -implement positive self-talk in care to increase self-esteem, confidence and feelings of control.  -consider alternative and complementary therapy approaches such as meditation, mindfulness or yoga.  -journaling, prayer, worship services, meditation or pastoral counseling.  -increase participation in pleasurable group activities such as hobbies, singing, sports or volunteering).  -consider the use of meditative movement therapy such as tai chi, yoga or qigong.  -start a regular daily exercise program based on tolerance, ability and patient choice to support positive thinking and activity      24- Hour Availability:    [facility]  [address] [place], [place] Crisis [phone]   [facility] the [employer] [place]  [phone]    [place]  [phone] (after hours)   Therapeutic Alternative/Mobile Crisis   [phone]   [place] National Suicide Hotline  970 181 9408 (TALK) OR 988   Call 911 or go to emergency room   [facility]  [phone]);  [place] and [employer]  [phone]); [place], [place], [place], [place], [place], [place], [place]          If you are experiencing a Mental Health or [place] or need someone to talk to, please call the Suicide and Crisis Lifeline: 988    Patient Goals:Follow up goal

## 2022-07-02 NOTE — Progress Notes (Deleted)
There were no vitals taken for this visit.   Subjective:    Patient ID: Douglas Greer, male    DOB: February 24, 1991, 32 y.o.   MRN: [id number]  HPI: Douglas Greer is a 32 y.o. male  No chief complaint on file.  Depression/anxiety: patient has been struggling with increasing anxiety and depression. At last appointment he wanted to get information to get in with a therapist. Provided him with list of local therapists and placed a referral to social work to help get in.  Patient has previously tried to get into [facility] but did not schedule an appointment.  Today he reports ***.      06/13/2022   11:06 AM 06/01/2022    2:07 PM 08/17/2021   10:52 AM 08/03/2021    8:49 AM 10/02/2019    8:43 AM  Depression screen PHQ 2/9  Decreased Interest 2 2 0 0 0  Down, Depressed, Hopeless 2 2 0 0 0  PHQ - 2 Score 4 4 0 0 0  Altered sleeping 3 3   0  Tired, decreased energy 2 1   0  Change in appetite 0 1   0  Feeling bad or failure about yourself  3 3   0  Trouble concentrating 1 2   0  Moving slowly or fidgety/restless 2 0   0  Suicidal thoughts 1 1   0  PHQ-9 Score 16 15   0  Difficult doing work/chores Somewhat difficult Somewhat difficult   Not difficult at all       06/01/2022    2:07 PM 08/17/2021   11:12 AM  GAD 7 : Generalized Anxiety Score  Nervous, Anxious, on Edge 2 1  Control/stop worrying 3 1  Worry too much - different things 3 1  Trouble relaxing 2 1  Restless 1 1  Easily annoyed or irritable 3 0  Afraid - awful might happen 3 3  Total GAD 7 Score 17 8  Anxiety Difficulty Somewhat difficult Not difficult at all    Smoking:  last visit patient verbalized wanting to quit smoking.  He was smoking about a pack a day.  We started him on combo of nicotine patch and chantix.  Patient reports ***  Relevant past medical, surgical, family and social history reviewed and updated as indicated. Interim medical history since our last visit reviewed. Allergies and medications reviewed  and updated.  Review of Systems  Constitutional: Negative for fever or weight change.  Respiratory: Negative for cough and shortness of breath.   Cardiovascular: Negative for chest pain or palpitations.  Gastrointestinal: Negative for abdominal pain, no bowel changes.  Musculoskeletal: Negative for gait problem or joint swelling.  Skin: Negative for rash.  Neurological: Negative for dizziness or headache.  No other specific complaints in a complete review of systems (except as listed in HPI above).      Objective:    There were no vitals taken for this visit.  Wt Readings from Last 3 Encounters:  06/01/22 183 lb 8 oz (83.2 kg)  08/21/21 191 lb (86.6 kg)  08/17/21 191 lb 3.2 oz (86.7 kg)    Physical Exam  Constitutional: Patient appears well-developed and well-nourished. No distress.  HEENT: head atraumatic, normocephalic, pupils equal and reactive to light, neck supple Cardiovascular: Normal rate, regular rhythm and normal heart sounds.  No murmur heard. No BLE edema. Pulmonary/Chest: Effort normal and breath sounds normal. No respiratory distress. Abdominal: Soft.  There is no tenderness. Psychiatric: Patient has a normal  mood and affect. behavior is normal. Judgment and thought content normal.  Results for orders placed or performed in visit on 08/03/21  Lipid panel  Result Value Ref Range   Cholesterol 148 <200 mg/dL   HDL 16 (L) > OR = 40 mg/dL   Triglycerides 199 (H) <150 mg/dL   LDL Cholesterol (Calc) 100 (H) mg/dL (calc)   Total CHOL/HDL Ratio 9.3 (H) <5.0 (calc)   Non-HDL Cholesterol (Calc) 132 (H) <130 mg/dL (calc)  CBC with Differential/Platelet  Result Value Ref Range   WBC 11.2 (H) 3.8 - 10.8 Thousand/uL   RBC 4.72 4.20 - 5.80 Million/uL   Hemoglobin 17.3 (H) 13.2 - 17.1 g/dL   HCT 47.8 38.5 - 50.0 %   MCV 101.3 (H) 80.0 - 100.0 fL   MCH 36.7 (H) 27.0 - 33.0 pg   MCHC 36.2 (H) 32.0 - 36.0 g/dL   RDW 12.2 11.0 - 15.0 %   Platelets 203 140 - 400 Thousand/uL    MPV 11.2 7.5 - 12.5 fL   Neutro Abs 6,485 1,500 - 7,800 cells/uL   Lymphs Abs 3,864 850 - 3,900 cells/uL   Absolute Monocytes 616 200 - 950 cells/uL   Eosinophils Absolute 202 15 - 500 cells/uL   Basophils Absolute 34 0 - 200 cells/uL   Neutrophils Relative % 57.9 %   Total Lymphocyte 34.5 %   Monocytes Relative 5.5 %   Eosinophils Relative 1.8 %   Basophils Relative 0.3 %  COMPLETE METABOLIC PANEL WITH GFR  Result Value Ref Range   Glucose, Bld 91 65 - 99 mg/dL   BUN 10 7 - 25 mg/dL   Creat 1.10 0.60 - 1.26 mg/dL   eGFR 91 > OR = 60 mL/min/1.47m   BUN/Creatinine Ratio NOT APPLICABLE 6 - 22 (calc)   Sodium 142 135 - 146 mmol/L   Potassium 3.9 3.5 - 5.3 mmol/L   Chloride 107 98 - 110 mmol/L   CO2 25 20 - 32 mmol/L   Calcium 9.5 8.6 - 10.3 mg/dL   Total Protein 6.9 6.1 - 8.1 g/dL   Albumin 4.6 3.6 - 5.1 g/dL   Globulin 2.3 1.9 - 3.7 g/dL (calc)   AG Ratio 2.0 1.0 - 2.5 (calc)   Total Bilirubin 0.8 0.2 - 1.2 mg/dL   Alkaline phosphatase (APISO) 81 36 - 130 U/L   AST 21 10 - 40 U/L   ALT 31 9 - 46 U/L  Testosterone  Result Value Ref Range   Testosterone 364 250 - 827 ng/dL  TSH  Result Value Ref Range   TSH 2.79 0.40 - 4.50 mIU/L      Assessment & Plan:   Problem List Items Addressed This Visit   None    Follow up plan: No follow-ups on file.

## 2022-07-03 ENCOUNTER — Ambulatory Visit: Payer: Medicaid Other | Admitting: Nurse Practitioner

## 2022-07-12 ENCOUNTER — Other Ambulatory Visit: Payer: Medicaid Other | Admitting: Licensed Clinical Social Worker

## 2022-07-12 NOTE — Patient Instructions (Signed)
Visit Information  Douglas Greer was given information about Medicaid Managed Care team care coordination services as a part of their [place] Medicaid benefit. [name] verbally consented to engagement with the [facility] Managed Care team.   If you are experiencing a medical emergency, please call 911 or report to your local emergency department or urgent care.   If you have a non-emergency medical problem during routine business hours, please contact your provider's office and ask to speak with a nurse.   For questions related to your [facility], please call: [phone] or visit the homepage here: [email or web address]  If you would like to schedule transportation through your [facility] [place], please call the following number at least 2 days in advance of your appointment: [phone]   Rides for urgent appointments can also be made after hours by calling Member Services.  Call the [place] at [phone], at any time, 24 hours a day, 7 days a week. If you are in danger or need immediate medical attention call 911.  If you would like help to quit smoking, call 1-800-QUIT-NOW 8255589458) OR Espaol: 1-855-Djelo-Ya (3-614-431-5400) o para ms informacin haga clic aqu or Text READY to 200-400 to register via text  Following is a copy of your plan of care:  Care Plan : LCSW Plan of Care  Updates made by Greg Cutter, LCSW since 07/12/2022 12:00 AM     Problem: Coping Skills (General Plan of Care)      Long-Range Goal: Coping Skills Enhanced   Start Date: 06/13/2022  Note:   Priority: High  Timeframe:  Long-Range Goal Priority:  High Start Date:   06/13/22        Expected End Date:  ongoing                     Follow Up Date- 07/25/22 at 230 pm  - keep 90 percent of scheduled appointments -consider counseling or  psychiatry -consider bumping up your self-care  -consider creating a stronger support network   Why is this important?             Combatting depression may take some time.            If you don't feel better right away, don't give up on your treatment plan.    Current barriers:   Chronic Mental Health needs related to depression, stress and anxiety. Patient requires Support, Education, Resources, Referrals, Advocacy, and Care Coordination, in order to meet Unmet Mental Health Needs. Patient will implement clinical interventions discussed today to decrease symptoms of depression and increase knowledge and/or ability of: coping skills. Mental Health Concerns and Social Isolation Patient lacks knowledge of available community counseling agencies and resources.  Clinical Goal(s): verbalize understanding of plan for management of Anxiety, Depression, and Stress and demonstrate a reduction in symptoms. Patient will connect with a provider for ongoing mental health treatment, increase coping skills, healthy habits, self-management skills, and stress reduction        Patient Goals/Self-Care Activities: Over the next 120 days Attend scheduled medical appointments Utilize healthy coping skills and supportive resources discussed Contact PCP with any questions or concerns Keep 90 percent of counseling appointments Call your insurance provider for more information about your Enhanced Benefits  Check out counseling resources provided  Begin personal counseling with LCSW, to reduce and manage symptoms of Depression and Stress, until well-established with mental health provider Accept all calls from  representative with [employer] in an effort to establish ongoing mental health counseling and supportive services. Incorporate into daily practice - relaxation techniques, deep breathing exercises, and mindfulness meditation strategies. Talk about feelings with friends, family members, spiritual advisor,  etc. Contact LCSW directly [phone]), if you have questions, need assistance, or if additional social work needs are identified between now and our next scheduled telephone outreach call. Call 988 for mental health hotline/crisis line if needed (24/7 available) Try techniques to reduce symptoms of anxiety/negative thinking (deep breathing, distraction, positive self talk, etc)  - develop a personal safety plan - develop a plan to deal with triggers like holidays, anniversaries - exercise at least 2 to 3 times per week - have a plan for how to handle bad days - journal feelings and what helps to feel better or worse - spend time or talk with others at least 2 to 3 times per week - watch for early signs of feeling worse - begin personal counseling - call and visit an old friend - check out volunteer opportunities - join a support group - laugh; watch a funny movie or comedian - learn and use visualization or guided imagery - perform a random act of kindness - practice relaxation or meditation daily - start or continue a personal journal - practice positive thinking and self-talk -continue with compliance of taking medication  -identify current effective and ineffective coping strategies.  -implement positive self-talk in care to increase self-esteem, confidence and feelings of control.  -consider alternative and complementary therapy approaches such as meditation, mindfulness or yoga.  -journaling, prayer, worship services, meditation or pastoral counseling.  -increase participation in pleasurable group activities such as hobbies, singing, sports or volunteering).  -consider the use of meditative movement therapy such as tai chi, yoga or qigong.  -start a regular daily exercise program based on tolerance, ability and patient choice to support positive thinking and activity      24- Hour Availability:    [facility]  [address] [place], [place] Crisis [phone]   [facility] the [employer] [place]  [phone]    [place]  [phone] (after hours)   Therapeutic Alternative/Mobile Crisis   [phone]   [place] National Suicide Hotline  (820)366-4302 (TALK) OR 988   Call 911 or go to emergency room   [facility]  [phone]);  [place] and [employer]  [phone]); [place], [place], [place], [address], [place], [place], [place]          If you are experiencing a Mental Health or [place] or need someone to talk to, please call the Suicide and Crisis Lifeline: 988    Patient Goals:Follow up goal

## 2022-07-12 NOTE — Patient Outreach (Signed)
Medicaid Managed Care Social Work Note  07/12/2022 Name:  Douglas Greer MRN:  [id number] DOB:  25-Sep-1990  Douglas Greer is an 32 y.o. year old male who is a primary patient of Bo Merino, FNP.  The Medicaid Managed Care Coordination team was consulted for assistance with:  Mental Health Counseling and Resources  Douglas Greer was given information about Medicaid Managed Care Coordination team services today. Douglas Greer Patient agreed to services and verbal consent obtained.  Engaged with patient  for by telephone forfollow up visit in response to referral for case management and/or care coordination services.   Assessments/Interventions:  Review of past medical history, allergies, medications, health status, including review of consultants reports, laboratory and other test data, was performed as part of comprehensive evaluation and provision of chronic care management services.  SDOH: (Social Determinant of Health) assessments and interventions performed: SDOH Interventions    Flowsheet Row Patient Outreach Telephone from 07/12/2022 in [place] Patient Outreach Telephone from 06/13/2022 in [place] Office Visit from 06/01/2022 in [facility] Office Visit from 08/03/2021 in [facility]  SDOH Interventions      Food Insecurity Interventions -- -- -- Intervention Not Indicated  Housing Interventions Intervention Not Indicated Intervention Not Indicated -- Intervention Not Indicated  Transportation Interventions -- Intervention Not Indicated -- Intervention Not Indicated  Depression Interventions/Treatment  -- Counseling Referral to Psychiatry --  Financial Strain Interventions -- -- -- Intervention Not Indicated  Physical Activity Interventions -- -- -- Other (Comments)  [discussed increasing phyiscal activity]  Stress Interventions Offered [employer], Provide Counseling -- -- Intervention Not Indicated  Social Connections Interventions -- -- -- Intervention Not Indicated       Advanced Directives Status:  See Care Plan for related entries.  Care Plan                 Allergies  Allergen Reactions   Amoxil [Amoxicillin] Rash    Medications Reviewed Today     Reviewed by Greg Cutter, LCSW (Social Worker) on 06/13/22 at 13  Med List Status: <None>   Medication Order Taking? Sig Documenting Provider Last Dose Status Informant  multivitamin (ONE-A-DAY MEN'S) TABS tablet [id number] No Take 1 tablet by mouth daily. [provider] Taking Active   nicotine (NICODERM CQ - DOSED IN MG/24 HOURS) 21 mg/24hr patch [id number]  Place 1 patch (21 mg total) onto the skin daily. Bo Merino, FNP  Active   Varenicline Tartrate, Starter, (CHANTIX STARTING MONTH PAK) 0.5 MG X 11 & 1 MG X 42 TBPK [id number]  Take 0.5 mg tablet by mouth 1x daily for 3 days, then increase to 0.5 mg tablet 2x daily for 4 days, then increase to 1 mg tablet 2x daily. 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 08/12/2018   Spondylosis without myelopathy or radiculopathy, cervical region 04/10/2018   Overweight 01/24/2018   Genital warts 10/04/2016   Tobacco use 11/27/2015   Total bilirubin, elevated 11/26/2015   Low HDL (under 40) 11/26/2015    Conditions to be addressed/monitored per PCP order:  Depression  Care  Plan : LCSW Plan of Care  Updates made by Gustavus Bryant, LCSW since 07/12/2022 12:00 AM     Problem: Coping Skills (General Plan of Care)      Long-Range Goal: Coping Skills Enhanced   Start Date: 06/13/2022  Note:    Priority: High  Timeframe:  Long-Range Goal Priority:  High Start Date:   06/13/22        Expected End Date:  ongoing                     Follow Up Date- 07/25/22 at 230 pm  - keep 90 percent of scheduled appointments -consider counseling or psychiatry -consider bumping up your self-care  -consider creating a stronger support network   Why is this important?             Combatting depression may take some time.            If you don't feel better right away, don't give up on your treatment plan.    Current barriers:   Chronic Mental Health needs related to depression, stress and anxiety. Patient requires Support, Education, Resources, Referrals, Advocacy, and Care Coordination, in order to meet Unmet Mental Health Needs. Patient will implement clinical interventions discussed today to decrease symptoms of depression and increase knowledge and/or ability of: coping skills. Mental Health Concerns and Social Isolation Patient lacks knowledge of available community counseling agencies and resources.  Clinical Goal(s): verbalize understanding of plan for management of Anxiety, Depression, and Stress and demonstrate a reduction in symptoms. Patient will connect with a provider for ongoing mental health treatment, increase coping skills, healthy habits, self-management skills, and stress reduction        Clinical Interventions:  Assessed patient's previous and current treatment, coping skills, support system and barriers to care. Patient provided hx  Patient and his mother and 42 yo son live together. Patient has a younger daughter as well but she does not reside with patient.  Verbalization of feelings encouraged, motivational interviewing employed Emotional support provided, positive coping strategies explored. Establishing healthy boundaries emphasized and healthy self-care education provided Patient was educated on available mental health resources within their area that accept Medicaid  and offer counseling and psychiatry. Patient is agreeable to referral to [facility] for counseling. Referral made on 06/14/22 and email sent to patient.  Email sent to patient today with instructions for scheduling at [facility] as well as some crisis support resources and [facility]'s walk in clinic hours Patient reports no needs with transportation or housing. Patient receives strong support from mother. LCSW provided education on relaxation techniques such as meditation, deep breathing, massage, grounding exercises or yoga that can activate the body's relaxation response and ease symptoms of stress and anxiety. LCSW ask that when pt is struggling with difficult emotions and racing thoughts that they start this relaxation response process. LCSW provided extensive education on healthy coping skills for anxiety. SW used active and reflective listening, validated patient's feelings/concerns, and provided emotional support. Patient will work on implementing appropriate self-care habits into their daily routine such as: staying positive, writing a gratitude list, drinking water, staying active around the house, taking their medications as prescribed, combating negative thoughts or emotions and staying connected with their family and friends. Positive reinforcement provided for this decision to work on this. LCSW provided education on healthy sleep hygiene and what that looks like. LCSW encouraged patient to implement a night time routine into their schedule that works best for them and that  they are able to maintain. Advised patient to implement deep breathing/grounding/meditation/self-care exercises into their nightly routine to combat racing thoughts at night. LCSW encouraged patient to wake up at the same time each day, make their sleeping environment comfortable, exercise when able, to limit naps and to not eat or drink anything right before bed.  Motivational Interviewing employed Depression screen reviewed  PHQ2/ PHQ9  completed or reviewed  Mindfulness or Relaxation training provided Active listening / Reflection utilized  Advance Care and HCPOA education provided Emotional Support Provided Problem Cascade strategies reviewed Provided psychoeducation for mental health needs  Provided brief CBT  Reviewed mental health medications and discussed importance of compliance:  Quality of sleep assessed & Sleep Hygiene techniques promoted  Participation in counseling encouraged  Verbalization of feelings encouraged  Suicidal Ideation/Homicidal Ideation assessed: Patient denies SI/HI  Review resources, discussed options and provided patient information about  [place] care team collaboration (see longitudinal plan of care) [facility] LCSW 06/29/22 update- Patient reports that he has not heard from [facility] yet but will contact them within the next week. Emotional support and self care education provided. [facility] LCSW 07/12/22 update- Patient reports that he received a call from [facility] and was inforemd that they no longer have their therapist and cannot accept her referral. [facility] LCSW educated patient on other options of Medicaid approved therapist in his area. He is agreeable to referral to [facility] for therapy for their in person or telephonic program. Referral completed on 07/12/22 and email was sent to patient with this referral information and scheduling instructions.  Patient Goals/Self-Care Activities: Over the next 120 days Attend scheduled medical appointments Utilize healthy coping skills and supportive resources discussed Contact PCP with any questions or concerns Keep 90 percent of counseling appointments Call your insurance provider for more information about your Enhanced Benefits  Check out counseling resources provided  Begin personal counseling with LCSW, to reduce and manage symptoms of Depression and Stress, until well-established with mental health provider Accept  all calls from representative with [employer] in an effort to establish ongoing mental health counseling and supportive services. Incorporate into daily practice - relaxation techniques, deep breathing exercises, and mindfulness meditation strategies. Talk about feelings with friends, family members, spiritual advisor, etc. Contact LCSW directly [phone]), if you have questions, need assistance, or if additional social work needs are identified between now and our next scheduled telephone outreach call. Call 988 for mental health hotline/crisis line if needed (24/7 available) Try techniques to reduce symptoms of anxiety/negative thinking (deep breathing, distraction, positive self talk, etc)  - develop a personal safety plan - develop a plan to deal with triggers like holidays, anniversaries - exercise at least 2 to 3 times per week - have a plan for how to handle bad days - journal feelings and what helps to feel better or worse - spend time or talk with others at least 2 to 3 times per week - watch for early signs of feeling worse - begin personal counseling - call and visit an old friend - check out volunteer opportunities - join a support group - laugh; watch a funny movie or comedian - learn and use visualization or guided imagery - perform a random act of kindness - practice relaxation or meditation daily - start or continue a personal journal - practice positive thinking and self-talk -continue with compliance of taking medication  -identify current effective and ineffective coping strategies.  -implement positive self-talk in care to increase self-esteem, confidence  and feelings of control.  -consider alternative and complementary therapy approaches such as meditation, mindfulness or yoga.  -journaling, prayer, worship services, meditation or pastoral counseling.  -increase participation in pleasurable group activities such as hobbies, singing, sports or volunteering).  -consider  the use of meditative movement therapy such as tai chi, yoga or qigong.  -start a regular daily exercise program based on tolerance, ability and patient choice to support positive thinking and activity      24- Hour Availability:    [facility]  [address], [place] Crisis [phone]   [facility] [employer] [place]  [phone]    [place]  [phone] (after hours)   Therapeutic Alternative/Mobile Crisis   [phone]   [place] National Suicide Hotline  418-564-5838 (TALK) OR 988   Call 911 or go to emergency room   [facility]  [phone]);  [place] and [employer]  [phone]); [place], [place], [place], [place], [place], [place], [place]          If you are experiencing a Mental Health or [place] or need someone to talk to, please call the Suicide and Crisis Lifeline: 988    Patient Goals:Follow up goal     Follow up:  Patient agrees to Care Plan and Follow-up.  Plan: The Managed Medicaid care management team will reach out to the patient again over the next 30 days.  Date/time of next scheduled Social Work care management/care coordination outreach:  07/25/22 at 230 pm.  Eula Fried, [place], MSW, [place] Medicaid LCSW [email or web address] Phone: [phone]

## 2022-07-25 ENCOUNTER — Other Ambulatory Visit: Payer: Medicaid Other | Admitting: Licensed Clinical Social Worker

## 2022-07-25 NOTE — Patient Outreach (Signed)
Medicaid Managed Care Social Work Note  07/25/2022 Name:  Douglas Greer MRN:  [id number] DOB:  December 02, 1990  Douglas Greer is an 32 y.o. year old male who is a primary patient of Bo Merino, FNP.  The Medicaid Managed Care Coordination team was consulted for assistance with:  Mental Health Counseling and Resources  Mr. Quain was given information about Medicaid Managed Care Coordination team services today. [name] Patient agreed to services and verbal consent obtained.  Engaged with patient  for by telephone forfollow up visit in response to referral for case management and/or care coordination services.   Assessments/Interventions:  Review of past medical history, allergies, medications, health status, including review of consultants reports, laboratory and other test data, was performed as part of comprehensive evaluation and provision of chronic care management services.  SDOH: (Social Determinant of Health) assessments and interventions performed: SDOH Interventions    Flowsheet Row Patient Outreach Telephone from 07/12/2022 in [place] Patient Outreach Telephone from 06/13/2022 in [place] Office Visit from 06/01/2022 in [facility] Office Visit from 08/03/2021 in [facility]  SDOH Interventions      Food Insecurity Interventions -- -- -- Intervention Not Indicated  Housing Interventions Intervention Not Indicated Intervention Not Indicated -- Intervention Not Indicated  Transportation Interventions -- Intervention Not Indicated -- Intervention Not Indicated  Depression Interventions/Treatment  -- Counseling Referral to Psychiatry --  Financial Strain Interventions -- -- -- Intervention Not Indicated  Physical Activity Interventions -- -- -- Other (Comments)  [discussed increasing phyiscal activity]  Stress Interventions Offered [employer], Provide Counseling -- -- Intervention Not Indicated  Social Connections Interventions -- -- -- Intervention Not Indicated       Advanced Directives Status:  See Care Plan for related entries.  Care Plan                 Allergies  Allergen Reactions   Amoxil [Amoxicillin] Rash    Medications Reviewed Today     Reviewed by Greg Cutter, LCSW (Social Worker) on 06/13/22 at 72  Med List Status: <None>   Medication Order Taking? Sig Documenting Provider Last Dose Status Informant  multivitamin (ONE-A-DAY MEN'S) TABS tablet [id number] No Take 1 tablet by mouth daily. [provider] Taking Active   nicotine (NICODERM CQ - DOSED IN MG/24 HOURS) 21 mg/24hr patch [id number]  Place 1 patch (21 mg total) onto the skin daily. Bo Merino, FNP  Active   Varenicline Tartrate, Starter, (CHANTIX STARTING MONTH PAK) 0.5 MG X 11 & 1 MG X 42 TBPK [id number]  Take 0.5 mg tablet by mouth 1x daily for 3 days, then increase to 0.5 mg tablet 2x daily for 4 days, then increase to 1 mg tablet 2x daily. 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 08/12/2018   Spondylosis without myelopathy or radiculopathy, cervical region 04/10/2018   Overweight 01/24/2018   Genital warts 10/04/2016   Tobacco use 11/27/2015   Total bilirubin, elevated 11/26/2015   Low HDL (under 40) 11/26/2015    Conditions to be addressed/monitored per PCP order:   Stress  Care Plan : LCSW Plan of Care  Updates made by Greg Cutter, LCSW since 07/25/2022 12:00 AM     Problem: Coping Skills (General Plan of Care)      Long-Range Goal: Coping Skills Enhanced   Start Date: 06/13/2022  Note:    Priority: High  Timeframe:  Long-Range Goal Priority:  High Start Date:   06/13/22        Expected End Date:  ongoing                     Follow Up Date- 08/08/22 at 115 pm  - keep 90 percent of scheduled appointments -consider counseling or psychiatry -consider bumping up your self-care  -consider creating a stronger support network   Why is this important?             Combatting depression may take some time.            If you don't feel better right away, don't give up on your treatment plan.    Current barriers:   Chronic Mental Health needs related to depression, stress and anxiety. Patient requires Support, Education, Resources, Referrals, Advocacy, and Care Coordination, in order to meet Unmet Mental Health Needs. Patient will implement clinical interventions discussed today to decrease symptoms of depression and increase knowledge and/or ability of: coping skills. Mental Health Concerns and Social Isolation Patient lacks knowledge of available community counseling agencies and resources.  Clinical Goal(s): verbalize understanding of plan for management of Anxiety, Depression, and Stress and demonstrate a reduction in symptoms. Patient will connect with a provider for ongoing mental health treatment, increase coping skills, healthy habits, self-management skills, and stress reduction        Clinical Interventions:  Assessed patient's previous and current treatment, coping skills, support system and barriers to care. Patient provided hx  Patient and his mother and 97 yo son live together. Patient has a younger daughter as well but she does not reside with patient.  Verbalization of feelings encouraged, motivational interviewing employed Emotional support provided, positive coping strategies explored. Establishing healthy boundaries emphasized and healthy self-care education provided Patient was educated on available mental health resources within their area that accept Medicaid  and offer counseling and psychiatry. Patient is agreeable to referral to [facility] for counseling. Referral made on 06/14/22 and email sent to patient.  Email sent to patient today with instructions for scheduling at [facility] as well as some crisis support resources and [facility]'s walk in clinic hours Patient reports no needs with transportation or housing. Patient receives strong support from mother. LCSW provided education on relaxation techniques such as meditation, deep breathing, massage, grounding exercises or yoga that can activate the body's relaxation response and ease symptoms of stress and anxiety. LCSW ask that when pt is struggling with difficult emotions and racing thoughts that they start this relaxation response process. LCSW provided extensive education on healthy coping skills for anxiety. SW used active and reflective listening, validated patient's feelings/concerns, and provided emotional support. Patient will work on implementing appropriate self-care habits into their daily routine such as: staying positive, writing a gratitude list, drinking water, staying active around the house, taking their medications as prescribed, combating negative thoughts or emotions and staying connected with their family and friends. Positive reinforcement provided for this decision to work on this. LCSW provided education on healthy sleep hygiene and what that looks like. LCSW encouraged patient to implement a night time routine into their schedule that works best for them and  that they are able to maintain. Advised patient to implement deep breathing/grounding/meditation/self-care exercises into their nightly routine to combat racing thoughts at night. LCSW encouraged patient to wake up at the same time each day, make their sleeping environment comfortable, exercise when able, to limit naps and to not eat or drink anything right before bed.  Motivational Interviewing employed Depression screen reviewed  PHQ2/ PHQ9  completed or reviewed  Mindfulness or Relaxation training provided Active listening / Reflection utilized  Advance Care and HCPOA education provided Emotional Support Provided Problem [place] strategies reviewed Provided psychoeducation for mental health needs  Provided brief CBT  Reviewed mental health medications and discussed importance of compliance:  Quality of sleep assessed & Sleep Hygiene techniques promoted  Participation in counseling encouraged  Verbalization of feelings encouraged  Suicidal Ideation/Homicidal Ideation assessed: Patient denies SI/HI  Review resources, discussed options and provided patient information about  [place] care team collaboration (see longitudinal plan of care) [facility] LCSW 06/29/22 update- Patient reports that he has not heard from [place] yet but will contact them within the next week. Emotional support and self care education provided. [facility] LCSW 07/12/22 update- Patient reports that he received a call from [facility] and was inforemd that they no longer have their therapist and cannot accept her referral. [facility] LCSW educated patient on other options of Medicaid approved therapist in his area. He is agreeable to referral to [facility] for therapy for their in person or telephonic program. Referral completed on 07/12/22 and email was sent to patient with this referral information and scheduling instructions. LCSW 07/25/22 update- Patient has his fist counseling session in person at [facility]. Patient was educated on the enrollment and intake process and what is to be expected during his first initial session. Patient was receptive to education provided. Brief anxiety management provided as well. Patient was encouraged to start breathing exercises when anxiety comes on.  Patient Goals/Self-Care Activities: Over the next 120 days Attend scheduled medical appointments Utilize healthy coping skills and supportive  resources discussed Contact PCP with any questions or concerns Keep 90 percent of counseling appointments Call your insurance provider for more information about your Enhanced Benefits  Check out counseling resources provided  Begin personal counseling with LCSW, to reduce and manage symptoms of Depression and Stress, until well-established with mental health provider Accept all calls from representative with [facility] in an effort to establish ongoing mental health counseling and supportive services. Incorporate into daily practice - relaxation techniques, deep breathing exercises, and mindfulness meditation strategies. Talk about feelings with friends, family members, spiritual advisor, etc. Contact LCSW directly [phone]), if you have questions, need assistance, or if additional social work needs are identified between now and our next scheduled telephone outreach call. Call 988 for mental health hotline/crisis line if needed (24/7 available) Try techniques to reduce symptoms of anxiety/negative thinking (deep breathing, distraction, positive self talk, etc)  - develop a personal safety plan - develop a plan to deal with triggers like holidays, anniversaries - exercise at least 2 to 3 times per week - have a plan for how to handle bad days - journal feelings and what helps to feel better or worse - spend time or talk with others at least 2 to 3 times per week - watch for early signs of feeling worse - begin personal counseling - call and visit an old friend - check out volunteer opportunities - join a support group - laugh; watch a funny movie  or comedian - learn and use visualization or guided imagery - perform a random act of kindness - practice relaxation or meditation daily - start or continue a personal journal - practice positive thinking and self-talk -continue with compliance of taking medication  -identify current effective and ineffective coping strategies.   -implement positive self-talk in care to increase self-esteem, confidence and feelings of control.  -consider alternative and complementary therapy approaches such as meditation, mindfulness or yoga.  -journaling, prayer, worship services, meditation or pastoral counseling.  -increase participation in pleasurable group activities such as hobbies, singing, sports or volunteering).  -consider the use of meditative movement therapy such as tai chi, yoga or qigong.  -start a regular daily exercise program based on tolerance, ability and patient choice to support positive thinking and activity      24- Hour Availability:    [facility]  [address] [place], [place] Crisis [phone]   [facility] [employer] [place]  [phone]    [place]  [phone] (after hours)   Therapeutic Alternative/Mobile Crisis   [phone]   [place] National Suicide Hotline  (234) 630-6578 (TALK) OR 988   Call 911 or go to emergency room   [facility]  [phone]);  [place] and [employer]  [phone]); [place], [place], [place], [place], [place], [place], [place]          If you are experiencing a Mental Health or [place] or need someone to talk to, please call the Suicide and Crisis Lifeline: 988    Patient Goals:Follow up goal     Follow up:  Patient agrees to Care Plan and Follow-up.  Plan: The Managed Medicaid care management team will reach out to the patient again over the next 30 days.  Date/time of next scheduled Social Work care management/care coordination outreach:  08/08/22 at 115 pm.  Eula Fried, [place], MSW, [place] Medicaid LCSW [email or web address] Phone: [phone]

## 2022-07-25 NOTE — Patient Instructions (Signed)
Visit Information  Douglas Greer was given information about Medicaid Managed Care team care coordination services as a part of their [place] Medicaid benefit. Douglas Greer verbally consented to engagement with the [facility] Managed Care team.   If you are experiencing a medical emergency, please call 911 or report to your local emergency department or urgent care.   If you have a non-emergency medical problem during routine business hours, please contact your provider's office and ask to speak with a nurse.   For questions related to your [facility], please call: [phone] or visit the homepage here: [email or web address]  If you would like to schedule transportation through your [facility], please call the following number at least 2 days in advance of your appointment: [phone]   Rides for urgent appointments can also be made after hours by calling Member Services.  Call the [place] at [phone], at any time, 24 hours a day, 7 days a week. If you are in danger or need immediate medical attention call 911.  If you would like help to quit smoking, call 1-800-QUIT-NOW (352)381-6902) OR Espaol: 1-855-Djelo-Ya HD:1601594) o para ms informacin haga clic aqu or Text READY to 200-400 to register via text   Following is a copy of your plan of care:  Care Plan : LCSW Plan of Care  Updates made by Greg Cutter, LCSW since 07/25/2022 12:00 AM     Problem: Coping Skills (General Plan of Care)      Long-Range Goal: Coping Skills Enhanced   Start Date: 06/13/2022  Note:   Priority: High  Timeframe:  Long-Range Goal Priority:  High Start Date:   06/13/22        Expected End Date:  ongoing                     Follow Up Date- 08/08/22 at 115 pm  - keep 90 percent of scheduled appointments -consider counseling or  psychiatry -consider bumping up your self-care  -consider creating a stronger support network   Why is this important?             Combatting depression may take some time.            If you don't feel better right away, don't give up on your treatment plan.    Current barriers:   Chronic Mental Health needs related to depression, stress and anxiety. Patient requires Support, Education, Resources, Referrals, Advocacy, and Care Coordination, in order to meet Unmet Mental Health Needs. Patient will implement clinical interventions discussed today to decrease symptoms of depression and increase knowledge and/or ability of: coping skills. Mental Health Concerns and Social Isolation Patient lacks knowledge of available community counseling agencies and resources.  Clinical Goal(s): verbalize understanding of plan for management of Anxiety, Depression, and Stress and demonstrate a reduction in symptoms. Patient will connect with a provider for ongoing mental health treatment, increase coping skills, healthy habits, self-management skills, and stress reduction        Patient Goals/Self-Care Activities: Over the next 120 days Attend scheduled medical appointments Utilize healthy coping skills and supportive resources discussed Contact PCP with any questions or concerns Keep 90 percent of counseling appointments Call your insurance provider for more information about your Enhanced Benefits  Check out counseling resources provided  Begin personal counseling with LCSW, to reduce and manage symptoms of Depression and Stress, until well-established with mental health provider Accept all calls  from representative with [facility] in an effort to establish ongoing mental health counseling and supportive services. Incorporate into daily practice - relaxation techniques, deep breathing exercises, and mindfulness meditation strategies. Talk about feelings with friends, family members, spiritual advisor,  etc. Contact LCSW directly [phone]), if you have questions, need assistance, or if additional social work needs are identified between now and our next scheduled telephone outreach call. Call 988 for mental health hotline/crisis line if needed (24/7 available) Try techniques to reduce symptoms of anxiety/negative thinking (deep breathing, distraction, positive self talk, etc)  - develop a personal safety plan - develop a plan to deal with triggers like holidays, anniversaries - exercise at least 2 to 3 times per week - have a plan for how to handle bad days - journal feelings and what helps to feel better or worse - spend time or talk with others at least 2 to 3 times per week - watch for early signs of feeling worse - begin personal counseling - call and visit an old friend - check out volunteer opportunities - join a support group - laugh; watch a funny movie or comedian - learn and use visualization or guided imagery - perform a random act of kindness - practice relaxation or meditation daily - start or continue a personal journal - practice positive thinking and self-talk -continue with compliance of taking medication  -identify current effective and ineffective coping strategies.  -implement positive self-talk in care to increase self-esteem, confidence and feelings of control.  -consider alternative and complementary therapy approaches such as meditation, mindfulness or yoga.  -journaling, prayer, worship services, meditation or pastoral counseling.  -increase participation in pleasurable group activities such as hobbies, singing, sports or volunteering).  -consider the use of meditative movement therapy such as tai chi, yoga or qigong.  -start a regular daily exercise program based on tolerance, ability and patient choice to support positive thinking and activity      24- Hour Availability:    [facility]  [address] [place], [place] Crisis [phone]   [facility] the [employer] [place]  [phone]    [place]  [phone] (after hours)   Therapeutic Alternative/Mobile Crisis   [phone]   [place] National Suicide Hotline  (340)154-6166 (TALK) OR 988   Call 911 or go to emergency room   [facility]  [phone]);  [place] and [employer]  [phone]); [place], [place], [place], [place], [place], [place], [place]          If you are experiencing a Mental Health or [place] or need someone to talk to, please call the Suicide and Crisis Lifeline: 988    Patient Goals:Follow up goal

## 2022-08-02 DIAGNOSIS — A630 Anogenital (venereal) warts: Secondary | ICD-10-CM | POA: Diagnosis not present

## 2022-08-08 ENCOUNTER — Other Ambulatory Visit: Payer: Medicaid Other | Admitting: Licensed Clinical Social Worker

## 2022-08-08 NOTE — Patient Instructions (Signed)
Douglas Greer ,   The [facility] Managed Care Team is available to provide assistance to you with your healthcare needs at no cost and as a benefit of your [facility] Health plan. I'm sorry I was unable to reach you today for our scheduled appointment. Our care guide will call you to reschedule our telephone appointment. Please call me at the number below. I am available to be of assistance to you regarding your healthcare needs. .   Thank you,   Eula Fried, BSW, MSW, LCSW Managed Medicaid LCSW [email or web address] Phone: [phone]

## 2022-08-08 NOTE — Patient Outreach (Signed)
  Medicaid Managed Care   Unsuccessful Attempt Note   08/08/2022 Name: Douglas Greer MRN: [id number] DOB: [birth year]  Referred by: Bo Merino, FNP Reason for referral : No chief complaint on file.   An unsuccessful telephone outreach was attempted today. The patient was referred to the case management team for assistance with care management and care coordination.    Follow Up Plan: A HIPAA compliant phone message was left for the patient providing contact information and requesting a return call.   Eula Fried, BSW, MSW, [employer] Managed Medicaid LCSW [email or web address] Phone: [phone]

## 2022-08-13 ENCOUNTER — Telehealth: Payer: Self-pay

## 2022-08-13 NOTE — Telephone Encounter (Signed)
..  Patient declines further follow up and engagement by the Managed Medicaid Team. Appropriate care team members and provider have been notified via electronic communication. The Managed Medicaid Team is available to follow up with the patient after provider conversation with the patient regarding recommendation for engagement and subsequent re-referral to the Managed Medicaid Team.    Debany Vantol Care Guide, High Risk Medicaid Managed Care Embedded Care Coordination [place]  [facility]   

## 2022-08-17 ENCOUNTER — Ambulatory Visit: Payer: Medicaid Other | Admitting: Nurse Practitioner

## 2022-08-17 NOTE — Progress Notes (Deleted)
There were no vitals taken for this visit.   Subjective:    Patient ID: Douglas Greer, male    DOB: 05-28-1991, 32 y.o.   MRN: [id number]  HPI: Douglas Greer is a 32 y.o. male  No chief complaint on file.   Relevant past medical, surgical, family and social history reviewed and updated as indicated. Interim medical history since our last visit reviewed. Allergies and medications reviewed and updated.  Review of Systems  Constitutional: Negative for fever or weight change.  Respiratory: Negative for cough and shortness of breath.   Cardiovascular: Negative for chest pain or palpitations.  Gastrointestinal: Negative for abdominal pain, no bowel changes.  Musculoskeletal: Negative for gait problem or joint swelling.  Skin: Negative for rash.  Neurological: Negative for dizziness or headache.  No other specific complaints in a complete review of systems (except as listed in HPI above).      Objective:    There were no vitals taken for this visit.  Wt Readings from Last 3 Encounters:  06/01/22 183 lb 8 oz (83.2 kg)  08/21/21 191 lb (86.6 kg)  08/17/21 191 lb 3.2 oz (86.7 kg)    Physical Exam  Constitutional: Patient appears well-developed and well-nourished. Obese *** No distress.  HEENT: head atraumatic, normocephalic, pupils equal and reactive to light, ears ***, neck supple, throat within normal limits Cardiovascular: Normal rate, regular rhythm and normal heart sounds.  No murmur heard. No BLE edema. Pulmonary/Chest: Effort normal and breath sounds normal. No respiratory distress. Abdominal: Soft.  There is no tenderness. Psychiatric: Patient has a normal mood and affect. behavior is normal. Judgment and thought content normal.   Results for orders placed or performed in visit on 08/03/21  Lipid panel  Result Value Ref Range   Cholesterol 148 <200 mg/dL   HDL 16 (L) > OR = 40 mg/dL   Triglycerides 199 (H) <150 mg/dL   LDL Cholesterol (Calc) 100 (H) mg/dL  (calc)   Total CHOL/HDL Ratio 9.3 (H) <5.0 (calc)   Non-HDL Cholesterol (Calc) 132 (H) <130 mg/dL (calc)  CBC with Differential/Platelet  Result Value Ref Range   WBC 11.2 (H) 3.8 - 10.8 Thousand/uL   RBC 4.72 4.20 - 5.80 Million/uL   Hemoglobin 17.3 (H) 13.2 - 17.1 g/dL   HCT 47.8 38.5 - 50.0 %   MCV 101.3 (H) 80.0 - 100.0 fL   MCH 36.7 (H) 27.0 - 33.0 pg   MCHC 36.2 (H) 32.0 - 36.0 g/dL   RDW 12.2 11.0 - 15.0 %   Platelets 203 140 - 400 Thousand/uL   MPV 11.2 7.5 - 12.5 fL   Neutro Abs 6,485 1,500 - 7,800 cells/uL   Lymphs Abs 3,864 850 - 3,900 cells/uL   Absolute Monocytes 616 200 - 950 cells/uL   Eosinophils Absolute 202 15 - 500 cells/uL   Basophils Absolute 34 0 - 200 cells/uL   Neutrophils Relative % 57.9 %   Total Lymphocyte 34.5 %   Monocytes Relative 5.5 %   Eosinophils Relative 1.8 %   Basophils Relative 0.3 %  COMPLETE METABOLIC PANEL WITH GFR  Result Value Ref Range   Glucose, Bld 91 65 - 99 mg/dL   BUN 10 7 - 25 mg/dL   Creat 1.10 0.60 - 1.26 mg/dL   eGFR 91 > OR = 60 mL/min/1.42m2   BUN/Creatinine Ratio NOT APPLICABLE 6 - 22 (calc)   Sodium 142 135 - 146 mmol/L   Potassium 3.9 3.5 - 5.3 mmol/L   Chloride 107 98 -  110 mmol/L   CO2 25 20 - 32 mmol/L   Calcium 9.5 8.6 - 10.3 mg/dL   Total Protein 6.9 6.1 - 8.1 g/dL   Albumin 4.6 3.6 - 5.1 g/dL   Globulin 2.3 1.9 - 3.7 g/dL (calc)   AG Ratio 2.0 1.0 - 2.5 (calc)   Total Bilirubin 0.8 0.2 - 1.2 mg/dL   Alkaline phosphatase (APISO) 81 36 - 130 U/L   AST 21 10 - 40 U/L   ALT 31 9 - 46 U/L  Testosterone  Result Value Ref Range   Testosterone 364 250 - 827 ng/dL  TSH  Result Value Ref Range   TSH 2.79 0.40 - 4.50 mIU/L      Assessment & Plan:   Problem List Items Addressed This Visit   None    Follow up plan: No follow-ups on file.

## 2022-08-21 NOTE — Progress Notes (Deleted)
There were no vitals taken for this visit.   Subjective:    Patient ID: Douglas Greer, male    DOB: 05-28-1991, 32 y.o.   MRN: [id number]  HPI: Douglas Greer is a 32 y.o. male  No chief complaint on file.   Relevant past medical, surgical, family and social history reviewed and updated as indicated. Interim medical history since our last visit reviewed. Allergies and medications reviewed and updated.  Review of Systems  Constitutional: Negative for fever or weight change.  Respiratory: Negative for cough and shortness of breath.   Cardiovascular: Negative for chest pain or palpitations.  Gastrointestinal: Negative for abdominal pain, no bowel changes.  Musculoskeletal: Negative for gait problem or joint swelling.  Skin: Negative for rash.  Neurological: Negative for dizziness or headache.  No other specific complaints in a complete review of systems (except as listed in HPI above).      Objective:    There were no vitals taken for this visit.  Wt Readings from Last 3 Encounters:  06/01/22 183 lb 8 oz (83.2 kg)  08/21/21 191 lb (86.6 kg)  08/17/21 191 lb 3.2 oz (86.7 kg)    Physical Exam  Constitutional: Patient appears well-developed and well-nourished. Obese *** No distress.  HEENT: head atraumatic, normocephalic, pupils equal and reactive to light, ears ***, neck supple, throat within normal limits Cardiovascular: Normal rate, regular rhythm and normal heart sounds.  No murmur heard. No BLE edema. Pulmonary/Chest: Effort normal and breath sounds normal. No respiratory distress. Abdominal: Soft.  There is no tenderness. Psychiatric: Patient has a normal mood and affect. behavior is normal. Judgment and thought content normal.   Results for orders placed or performed in visit on 08/03/21  Lipid panel  Result Value Ref Range   Cholesterol 148 <200 mg/dL   HDL 16 (L) > OR = 40 mg/dL   Triglycerides 199 (H) <150 mg/dL   LDL Cholesterol (Calc) 100 (H) mg/dL  (calc)   Total CHOL/HDL Ratio 9.3 (H) <5.0 (calc)   Non-HDL Cholesterol (Calc) 132 (H) <130 mg/dL (calc)  CBC with Differential/Platelet  Result Value Ref Range   WBC 11.2 (H) 3.8 - 10.8 Thousand/uL   RBC 4.72 4.20 - 5.80 Million/uL   Hemoglobin 17.3 (H) 13.2 - 17.1 g/dL   HCT 47.8 38.5 - 50.0 %   MCV 101.3 (H) 80.0 - 100.0 fL   MCH 36.7 (H) 27.0 - 33.0 pg   MCHC 36.2 (H) 32.0 - 36.0 g/dL   RDW 12.2 11.0 - 15.0 %   Platelets 203 140 - 400 Thousand/uL   MPV 11.2 7.5 - 12.5 fL   Neutro Abs 6,485 1,500 - 7,800 cells/uL   Lymphs Abs 3,864 850 - 3,900 cells/uL   Absolute Monocytes 616 200 - 950 cells/uL   Eosinophils Absolute 202 15 - 500 cells/uL   Basophils Absolute 34 0 - 200 cells/uL   Neutrophils Relative % 57.9 %   Total Lymphocyte 34.5 %   Monocytes Relative 5.5 %   Eosinophils Relative 1.8 %   Basophils Relative 0.3 %  COMPLETE METABOLIC PANEL WITH GFR  Result Value Ref Range   Glucose, Bld 91 65 - 99 mg/dL   BUN 10 7 - 25 mg/dL   Creat 1.10 0.60 - 1.26 mg/dL   eGFR 91 > OR = 60 mL/min/1.42m2   BUN/Creatinine Ratio NOT APPLICABLE 6 - 22 (calc)   Sodium 142 135 - 146 mmol/L   Potassium 3.9 3.5 - 5.3 mmol/L   Chloride 107 98 -  110 mmol/L   CO2 25 20 - 32 mmol/L   Calcium 9.5 8.6 - 10.3 mg/dL   Total Protein 6.9 6.1 - 8.1 g/dL   Albumin 4.6 3.6 - 5.1 g/dL   Globulin 2.3 1.9 - 3.7 g/dL (calc)   AG Ratio 2.0 1.0 - 2.5 (calc)   Total Bilirubin 0.8 0.2 - 1.2 mg/dL   Alkaline phosphatase (APISO) 81 36 - 130 U/L   AST 21 10 - 40 U/L   ALT 31 9 - 46 U/L  Testosterone  Result Value Ref Range   Testosterone 364 250 - 827 ng/dL  TSH  Result Value Ref Range   TSH 2.79 0.40 - 4.50 mIU/L      Assessment & Plan:   Problem List Items Addressed This Visit   None    Follow up plan: No follow-ups on file.

## 2022-08-23 ENCOUNTER — Ambulatory Visit: Payer: Medicaid Other | Admitting: Nurse Practitioner

## 2022-08-30 ENCOUNTER — Telehealth: Payer: Medicaid Other

## 2022-10-26 ENCOUNTER — Ambulatory Visit
Admission: EM | Admit: 2022-10-26 | Discharge: 2022-10-26 | Disposition: A | Discharge status: Home / Self Care | Payer: Medicaid Other | Attending: Urgent Care | Admitting: Urgent Care

## 2022-10-26 DIAGNOSIS — R319 Hematuria, unspecified: Secondary | ICD-10-CM | POA: Diagnosis not present

## 2022-10-26 LAB — POCT URINALYSIS DIP (MANUAL ENTRY)
Glucose, UA: NEGATIVE mg/dL
Ketones, POC UA: NEGATIVE mg/dL
Leukocytes, UA: NEGATIVE
Nitrite, UA: NEGATIVE
Protein Ur, POC: NEGATIVE mg/dL
Spec Grav, UA: 1.025 (ref 1.010–1.025)
Urobilinogen, UA: 2 E.U./dL — AB
pH, UA: 5.5 (ref 5.0–8.0)

## 2022-10-26 NOTE — ED Provider Notes (Signed)
[name]    CSN: [id number] Arrival date & time: 10/26/22  1308      History   Chief Complaint Chief Complaint  Patient presents with   Hematuria    HPI Julia Dec is a 32 y.o. male.    Hematuria    Patient presents to urgent care with complaint of hematuria since yesterday.  He requests STD testing.  Denies fever or abdominal pain.  Denies penile discharge or presence of lesions.  He is sexually active.  Denies risk or exposure to STDs.  Endorses "sometimes" using condoms with single partner.  States the blood in his urine was a "small" clot which was present in his urine.  Single episode when he awoke from sleep.  Past Medical History:  Diagnosis Date   Colitis presumed infectious 12/24/2018   Elevated hemoglobin (HCC) 06/21/2017   High hematocrit 06/21/2017   Low HDL (under 40) 11/26/2015   Neck arthritis 09/27/2017   Tobacco use 11/27/2015   Verruca plana 11/25/2015    Patient Active Problem List   Diagnosis Date Noted   Moderate episode of recurrent major depressive disorder (HCC) 06/01/2022   Anxiety 06/01/2022   Mixed hyperlipidemia 09/04/2019   Spontaneous pneumothorax 08/25/2019   Tobacco dependence 08/25/2019   Elevated serum glutamic pyruvic transaminase (SGPT) level 08/12/2018   Spondylosis without myelopathy or radiculopathy, cervical region 04/10/2018   Overweight 01/24/2018   Genital warts 10/04/2016   Tobacco use 11/27/2015   Total bilirubin, elevated 11/26/2015   Low HDL (under 40) 11/26/2015    Past Surgical History:  Procedure Laterality Date   INCISIONAL HERNIA REPAIR     NASAL FRACTURE SURGERY         Home Medications    Prior to Admission medications   Medication Sig Start Date End Date Taking? Authorizing Provider  multivitamin (ONE-A-DAY MEN'S) TABS tablet Take 1 tablet by mouth daily.    [provider]  nicotine (NICODERM CQ - DOSED IN MG/24 HOURS) 21 mg/24hr patch Place 1 patch (21 mg total) onto  the skin daily. 06/01/22   Berniece Salines, FNP  Varenicline Tartrate, Starter, (CHANTIX STARTING MONTH PAK) 0.5 MG X 11 & 1 MG X 42 TBPK Take 0.5 mg tablet by mouth 1x daily for 3 days, then increase to 0.5 mg tablet 2x daily for 4 days, then increase to 1 mg tablet 2x daily. 06/01/22   Berniece Salines, FNP    Family History Family History  Problem Relation Age of Onset   Hypertension Mother    Diabetes Father    Asthma Son    Allergies Son        peanuts    Social History Social History   Tobacco Use   Smoking status: Every Day    Packs/day: 1.00    Years: 12.00    Additional pack years: 0.00    Total pack years: 12.00    Types: Cigarettes   Smokeless tobacco: Never  Vaping Use   Vaping Use: Never used  Substance Use Topics   Alcohol use: Yes    Alcohol/week: 0.0 standard drinks of alcohol    Comment: occassional   Drug use: Yes    Types: Marijuana     Allergies   Amoxil [amoxicillin]   Review of Systems Review of Systems  Genitourinary:  Positive for hematuria.     Physical Exam Triage Vital Signs ED Triage Vitals [10/26/22 1034]  Enc Vitals Group     BP 130/83     Pulse  Rate 73     Resp 16     Temp 98.3 F (36.8 C)     Temp Source Temporal     SpO2 95 %     Weight      Height      Head Circumference      Peak Flow      Pain Score 0     Pain Loc      Pain Edu?      Excl. in GC?    No data found.  Updated Vital Signs BP 130/83 (BP Location: Right Arm)   Pulse 73   Temp 98.3 F (36.8 C) (Temporal)   Resp 16   SpO2 95%   Visual Acuity Right Eye Distance:   Left Eye Distance:   Bilateral Distance:    Right Eye Near:   Left Eye Near:    Bilateral Near:     Physical Exam Vitals reviewed.  Constitutional:      Appearance: Normal appearance.  Skin:    General: Skin is warm and dry.  Neurological:     General: No focal deficit present.     Mental Status: He is alert and oriented to person, place, and time.  Psychiatric:         Mood and Affect: Mood normal.        Behavior: Behavior normal.      UC Treatments / Results  Labs (all labs ordered are listed, but only abnormal results are displayed) Labs Reviewed  POCT URINALYSIS DIP (MANUAL ENTRY)  CYTOLOGY, (ORAL, ANAL, URETHRAL) ANCILLARY ONLY    EKG   Radiology No results found.  Procedures Procedures (including critical care time)  Medications Ordered in UC Medications - No data to display  Initial Impression / Assessment and Plan / UC Course  I have reviewed the triage vital signs and the nursing notes.  Pertinent labs & imaging results that were available during my care of the patient were reviewed by me and considered in my medical decision making (see chart for details).   UA result is not suggestive of urinary tract infection.  However there was trace blood present. Urethral self swab was obtained and results pending.  Patient was told to follow-up with a urology evaluation if the results of today's testing are negative.   Final Clinical Impressions(s) / UC Diagnoses   Final diagnoses:  Hematuria, unspecified type   Discharge Instructions   None    ED Prescriptions   None    PDMP not reviewed this encounter.Not indicative of urinary tract infection.  There is trace blood present   Charma Igo, [place] 10/26/22 1116

## 2022-10-26 NOTE — Discharge Instructions (Addendum)
Your urine does not show any sign of a urinary tract infection.  There is a trace amount of blood present.  Results from your other testing will be available in several days.  If there is a positive result that requires treatment, you will be contacted by telephone.  If your result is negative, I would recommend following up with your primary care provider or with a urology evaluation.

## 2022-10-26 NOTE — ED Triage Notes (Signed)
Patient presents to UC for hematuria since yesterday. Requesting STD testing.    Denies fever or abdominal pain.

## 2022-10-30 LAB — CYTOLOGY, (ORAL, ANAL, URETHRAL) ANCILLARY ONLY
Chlamydia: NEGATIVE
Comment: NEGATIVE
Comment: NEGATIVE
Comment: NORMAL
Neisseria Gonorrhea: NEGATIVE
Trichomonas: NEGATIVE

## 2022-11-07 ENCOUNTER — Encounter: Payer: Medicaid Other | Admitting: Nurse Practitioner

## 2022-11-08 DIAGNOSIS — Z113 Encounter for screening for infections with a predominantly sexual mode of transmission: Secondary | ICD-10-CM | POA: Diagnosis not present

## 2022-11-08 DIAGNOSIS — R300 Dysuria: Secondary | ICD-10-CM | POA: Diagnosis not present

## 2022-11-09 ENCOUNTER — Other Ambulatory Visit: Payer: Self-pay

## 2022-11-09 ENCOUNTER — Ambulatory Visit (INDEPENDENT_AMBULATORY_CARE_PROVIDER_SITE_OTHER): Payer: Medicaid Other | Admitting: Nurse Practitioner

## 2022-11-09 VITALS — BP 112/70 | HR 92 | Temp 97.8°F | Resp 18 | Ht 68.0 in | Wt 179.5 lb

## 2022-11-09 DIAGNOSIS — R319 Hematuria, unspecified: Secondary | ICD-10-CM

## 2022-11-09 NOTE — Progress Notes (Signed)
BP 112/70   Pulse 92   Temp 97.8 F (36.6 C) (Oral)   Resp 18   Ht 5\' 8"  (1.727 m)   Wt 179 lb 8 oz (81.4 kg)   SpO2 95%   BMI 27.29 kg/m    Subjective:    Patient ID: Douglas Greer, male    DOB: [birth year], 32 y.o.   MRN: [id number]  HPI: Douglas Greer is a 32 y.o. male  Chief Complaint  Patient presents with   Follow-up    Seen in UC    Urgent care follow up/ hematuria: patient was seen at urgent care on 10/26/2022 for hematuria.   Urgent care note: Patient presents to urgent care with complaint of hematuria since yesterday.  He requests STD testing.  Denies fever or abdominal pain.  Denies penile discharge or presence of lesions. He is sexually active.  Denies risk or exposure to STDs.  Endorses "sometimes" using condoms with single partner. States the blood in his urine was a "small" clot which was present in his urine.  Single episode when he awoke from sleep.  Urinalysis: positive for blood. Urine cytology was negative for gonorrhea, trichomonas and chlamydia.  He says he has not noticed any more blood but has discomfort in the penile shaft. No pain with erection or sexual activity. No dysuria. Will get complete urinalysis, he has an appointment with urology on 11/14/2022.   Relevant past medical, surgical, family and social history reviewed and updated as indicated. Interim medical history since our last visit reviewed. Allergies and medications reviewed and updated.  Review of Systems  Constitutional: Negative for fever or weight change.  Respiratory: Negative for cough and shortness of breath.   Cardiovascular: Negative for chest pain or palpitations.  Gastrointestinal: Negative for abdominal pain, no bowel changes.  Musculoskeletal: Negative for gait problem or joint swelling.  Skin: Negative for rash.  Neurological: Negative for dizziness or headache.  No other specific complaints in a complete review of systems (except as listed in HPI above).       Objective:    BP 112/70   Pulse 92   Temp 97.8 F (36.6 C) (Oral)   Resp 18   Ht 5\' 8"  (1.727 m)   Wt 179 lb 8 oz (81.4 kg)   SpO2 95%   BMI 27.29 kg/m   Wt Readings from Last 3 Encounters:  11/09/22 179 lb 8 oz (81.4 kg)  06/01/22 183 lb 8 oz (83.2 kg)  08/21/21 191 lb (86.6 kg)    Physical Exam  Constitutional: Patient appears well-developed and well-nourished.  No distress.  HEENT: head atraumatic, normocephalic, pupils equal and reactive to light, neck supple Cardiovascular: Normal rate, regular rhythm and normal heart sounds.  No murmur heard. No BLE edema. Pulmonary/Chest: Effort normal and breath sounds normal. No respiratory distress. Abdominal: Soft.  There is no tenderness. Psychiatric: Patient has a normal mood and affect. behavior is normal. Judgment and thought content normal.   Results for orders placed or performed during the hospital encounter of 10/26/22  POCT urinalysis dipstick  Result Value Ref Range   Color, UA yellow yellow   Clarity, UA cloudy (A) clear   Glucose, UA negative negative mg/dL   Bilirubin, UA small (A) negative   Ketones, POC UA negative negative mg/dL   Spec Grav, UA 7.829 5.621 - 1.025   Blood, UA trace-intact (A) negative   pH, UA 5.5 5.0 - 8.0   Protein Ur, POC negative negative mg/dL   Urobilinogen, UA  2.0 (A) 0.2 or 1.0 E.U./dL   Nitrite, UA Negative Negative   Leukocytes, UA Negative Negative  Cytology (oral, anal, urethral) ancillary only  Result Value Ref Range   Neisseria Gonorrhea Negative    Chlamydia Negative    Trichomonas Negative    Comment Normal Reference Range Trichomonas - Negative    Comment Normal Reference Ranger Chlamydia - Negative    Comment      Normal Reference Range Neisseria Gonorrhea - Negative      Assessment & Plan:   Problem List Items Addressed This Visit   None Visit Diagnoses     Hematuria, unspecified type    -  Primary   will get urinalysis with reflux micro,  patient has  appointment with urology on 11/14/2022.   Relevant Orders   Urinalysis, Routine w reflex microscopic        Follow up plan: Return if symptoms worsen or fail to improve.

## 2022-11-10 LAB — URINALYSIS, ROUTINE W REFLEX MICROSCOPIC
Bacteria, UA: NONE SEEN /HPF
Bilirubin Urine: NEGATIVE
Glucose, UA: NEGATIVE
Hgb urine dipstick: NEGATIVE
Hyaline Cast: NONE SEEN /LPF
Ketones, ur: NEGATIVE
Leukocytes,Ua: NEGATIVE
Nitrite: NEGATIVE
Specific Gravity, Urine: 1.031 (ref 1.001–1.035)
Squamous Epithelial / HPF: NONE SEEN /HPF (ref <=5)
pH: 5.5 (ref 5.0–8.0)

## 2022-11-13 ENCOUNTER — Ambulatory Visit: Payer: Medicaid Other | Admitting: Urology

## 2022-11-14 ENCOUNTER — Encounter: Payer: Self-pay | Admitting: Urology

## 2022-11-14 ENCOUNTER — Ambulatory Visit (INDEPENDENT_AMBULATORY_CARE_PROVIDER_SITE_OTHER): Payer: Medicaid Other | Admitting: Urology

## 2022-11-14 VITALS — BP 119/79 | HR 87 | Ht 68.0 in | Wt 184.0 lb

## 2022-11-14 DIAGNOSIS — R319 Hematuria, unspecified: Secondary | ICD-10-CM

## 2022-11-14 DIAGNOSIS — N529 Male erectile dysfunction, unspecified: Secondary | ICD-10-CM | POA: Diagnosis not present

## 2022-11-14 DIAGNOSIS — R5383 Other fatigue: Secondary | ICD-10-CM

## 2022-11-14 MED ORDER — TADALAFIL 20 MG PO TABS: ORAL_TABLET | ORAL | 0 refills | Status: DC

## 2022-11-14 NOTE — Progress Notes (Signed)
I, Duke Salvia, acting as a Neurosurgeon for Riki Altes, MD., have documented all relevant documentation on the behalf of Riki Altes, MD, as directed by  Riki Altes, MD while in the presence of Riki Altes, MD.   11/14/2022 3:35 PM   Leta Speller 02-09-1991 [id number]  Referring provider: Berniece Salines, FNP [address] [place],  [place] [address]  Chief Complaint  Patient presents with   Erectile Dysfunction     HPI: Douglas Greer is a 32 y.o. male who presents for evaluation of erectile dysfunction.  I saw him March 2023 for a vasectomy consultation. He apparently made a MyChart visit for erectile dysfunction with Dr. Richardo Hanks and was rescheduled to me as he is an established patient with a previous vasectomy consultation. Was seen at [facility] late May 2024 complaining of gross hematuria with passing a small clot. Dipstick urinalysis at that visit showed trace blood. Follow up visit with his PCP 11/09/2022 showed no hematuria. Urinalysis in 2020 showed 20-50 RBCs. 6 month history of difficulty achieving and occasionally maintaining an erection. Previous testosterone level low-normal at 364 does relate to some decreased libido, tiredness, and fatigue.  No diabetes, hypertension, or neurologic problems. One half pack per day smoker over the last 10 years. Seen in urgent care when he noted a small streak of blood in his urine, though his urine was yellow in appearance. He had mild discomfort with voiding before this happened which persisted for a day. CT scan in 2020 showed no urinary calculi.   PMH: Past Medical History:  Diagnosis Date   Colitis presumed infectious 12/24/2018   Elevated hemoglobin (HCC) 06/21/2017   High hematocrit 06/21/2017   Low HDL (under 40) 11/26/2015   Neck arthritis 09/27/2017   Tobacco use 11/27/2015   Verruca plana 11/25/2015    Surgical History: Past Surgical History:  Procedure Laterality  Date   INCISIONAL HERNIA REPAIR     NASAL FRACTURE SURGERY      Home Medications:  Allergies as of 11/14/2022       Reactions   Amoxil [amoxicillin] Rash        Medication List        Accurate as of November 14, 2022  3:35 PM. If you have any questions, ask your nurse or doctor.          multivitamin Tabs tablet Take 1 tablet by mouth daily.   nicotine 21 mg/24hr patch Commonly known as: NICODERM CQ - dosed in mg/24 hours Place 1 patch (21 mg total) onto the skin daily.   tadalafil 20 MG tablet Commonly known as: CIALIS Take 1 tablet 1 hour prior to intercourse. Started by: Riki Altes, MD   Varenicline Tartrate (Starter) 0.5 MG X 11 & 1 MG X 42 Tbpk Commonly known as: Chantix Starting Month Pak Take 0.5 mg tablet by mouth 1x daily for 3 days, then increase to 0.5 mg tablet 2x daily for 4 days, then increase to 1 mg tablet 2x daily.        Allergies:  Allergies  Allergen Reactions   Amoxil [Amoxicillin] Rash    Family History: Family History  Problem Relation Age of Onset   Hypertension Mother    Diabetes Father    Asthma Son    Allergies Son        peanuts    Social History:  reports that he has been smoking cigarettes. He has a 12.00 pack-year smoking history. He  has never used smokeless tobacco. He reports current alcohol use. He reports current drug use. Drug: Marijuana.   Physical Exam: BP 119/79   Pulse 87   Ht 5\' 8"  (1.727 m)   Wt 184 lb (83.5 kg)   BMI 27.98 kg/m   Constitutional:  Alert and oriented, No acute distress. HEENT: [place] AT Respiratory: Normal respiratory effort, no increased work of breathing. Psychiatric: Normal mood and affect.   Assessment & Plan:    1. Erectile dysfunction Prior testosterone level low-normal. He does note tiredness, fatigue, and some decreased libido. Recheck AM testosterone level and LH.  Interested in a PDE5 inhibitor trial and RX Tadalafil 20 mg in pharmacy.  2. Hematuria Follow up urinalysis  was clear. He noted a small blood streak in his urine and unlikely from a bladder or upper tract source. It may be urethral or prostatic in etiology. Discussed the recommended evaluation for gross hematuria, which consists of a CT urogram and cystoscopy. He declined further evaluation at this time, but indicated he would proceed if symptoms recurred.  I have reviewed the above documentation for accuracy and completeness, and I agree with the above.   Riki Altes, MD  [facility] [address], [address] [place], [place] [address] [phone]

## 2022-11-16 ENCOUNTER — Other Ambulatory Visit: Payer: Self-pay

## 2022-11-16 DIAGNOSIS — N529 Male erectile dysfunction, unspecified: Secondary | ICD-10-CM

## 2022-11-16 DIAGNOSIS — R5383 Other fatigue: Secondary | ICD-10-CM

## 2022-11-20 ENCOUNTER — Encounter: Payer: Self-pay | Admitting: Urology

## 2022-11-20 ENCOUNTER — Other Ambulatory Visit: Payer: Medicaid Other

## 2022-11-28 NOTE — Progress Notes (Signed)
Name: Douglas Greer   MRN: [id number]    DOB: [birth year]   Date:11/30/2022       Progress Note  Subjective  Chief Complaint  Chief Complaint  Patient presents with   Annual Exam    HPI  Patient presents for annual CPE .  IPSS Questionnaire (AUA-7): Over the past month.   1)  How often have you had a sensation of not emptying your bladder completely after you finish urinating?  0 - Not at all  2)  How often have you had to urinate again less than two hours after you finished urinating? 0 - Not at all  3)  How often have you found you stopped and started again several times when you urinated?  0 - Not at all  4) How difficult have you found it to postpone urination?  0 - Not at all  5) How often have you had a weak urinary stream?  0 - Not at all  6) How often have you had to push or strain to begin urination?  0 - Not at all  7) How many times did you most typically get up to urinate from the time you went to bed until the time you got up in the morning?  0 - None  Total score:  0-7 mildly symptomatic   8-19 moderately symptomatic   20-35 severely symptomatic     Diet: tries to eat well balanced Exercise: 5 days a week 60 minutes Sleep: about 4 hours Last Dental Exam: April 2024 Last Eye Exam: August 2023  Depression: phq 9 is positive, patient reports mood has been the same, stable    11/30/2022    2:33 PM 11/30/2022    2:23 PM 11/09/2022   11:52 AM 06/13/2022   11:06 AM 06/01/2022    2:07 PM  Depression screen PHQ 2/9  Decreased Interest 0 0 0 2 2  Down, Depressed, Hopeless 0 0 0 2 2  PHQ - 2 Score 0 0 0 4 4  Altered sleeping 2   3 3   Tired, decreased energy 1   2 1   Change in appetite 0   0 1  Feeling bad or failure about yourself  1   3 3   Trouble concentrating 1   1 2   Moving slowly or fidgety/restless 0   2 0  Suicidal thoughts 0   1 1  PHQ-9 Score 5   16 15   Difficult doing work/chores Not difficult at all   Somewhat difficult Somewhat difficult     Hypertension:  BP Readings from Last 3 Encounters:  11/30/22 118/76  11/14/22 119/79  11/09/22 112/70    Obesity: Wt Readings from Last 3 Encounters:  11/30/22 179 lb (81.2 kg)  11/14/22 184 lb (83.5 kg)  11/09/22 179 lb 8 oz (81.4 kg)   BMI Readings from Last 3 Encounters:  11/30/22 27.22 kg/m  11/14/22 27.98 kg/m  11/09/22 27.29 kg/m     Lipids:  Lab Results  Component Value Date   CHOL 148 08/03/2021   CHOL 165 02/11/2019   CHOL 175 08/11/2018   Lab Results  Component Value Date   HDL 16 (L) 08/03/2021   HDL 17 (L) 02/11/2019   HDL 20 (L) 08/11/2018   Lab Results  Component Value Date   LDLCALC 100 (H) 08/03/2021   LDLCALC 107 (H) 02/11/2019   LDLCALC 125 (H) 08/11/2018   Lab Results  Component Value Date   TRIG 199 (H) 08/03/2021   TRIG 304 (  H) 02/11/2019   TRIG 179 (H) 08/11/2018   Lab Results  Component Value Date   CHOLHDL 9.3 (H) 08/03/2021   CHOLHDL 9.7 (H) 02/11/2019   CHOLHDL 8.8 (H) 08/11/2018   No results found for: "LDLDIRECT" Glucose:  Glucose, Bld  Date Value Ref Range Status  08/03/2021 91 65 - 99 mg/dL Final    Comment:    .            Fasting reference interval .   02/11/2019 95 65 - 99 mg/dL Final    Comment:    .            Fasting reference interval .   12/25/2018 105 (H) 70 - 99 mg/dL Final    Flowsheet Row Office Visit from 11/30/2022 in [facility]  AUDIT-C Score 1        Single STD testing and prevention (HIV/chl/gon/syphilis):  yes 03/13/2019 Sexual history: yes Hep C Screening: 03/13/2019 Skin cancer: Discussed monitoring for atypical lesions Colorectal cancer: NA Prostate cancer:  not applicable No results found for: "PSA"   Lung cancer:  Low Dose CT Chest recommended if Age 15-80 years, 30 pack-year currently smoking OR have quit w/in 15years. Patient  not applicable a candidate for screening   AAA: The USPSTF recommends one-time screening with ultrasonography in men  ages 57 to 75 years who have ever smoked. Patient   not, a candidate for screening  ECG:  none  Vaccines:  HPV: up to at age 55 , ask insurance if age between 5-45  Shingrix: 38-64 yo and ask insurance if covered when patient above 43 yo Pneumonia:  educated and discussed with patient. Flu:  educated and discussed with patient.    Advanced Care Planning: A voluntary discussion about advance care planning including the explanation and discussion of advance directives.  Discussed health care proxy and Living will, and the patient was able to identify a health care proxy as aunt.  Patient does not have a living will and power of attorney of health care   Patient Active Problem List   Diagnosis Date Noted   Moderate episode of recurrent major depressive disorder (HCC) 06/01/2022   Anxiety 06/01/2022   Mixed hyperlipidemia 09/04/2019   Spontaneous pneumothorax 08/25/2019   Tobacco dependence 08/25/2019   Elevated serum glutamic pyruvic transaminase (SGPT) level 08/12/2018   Spondylosis without myelopathy or radiculopathy, cervical region 04/10/2018   Overweight 01/24/2018   Genital warts 10/04/2016   Tobacco use 11/27/2015   Total bilirubin, elevated 11/26/2015   Low HDL (under 40) 11/26/2015    Past Surgical History:  Procedure Laterality Date   INCISIONAL HERNIA REPAIR     NASAL FRACTURE SURGERY      Family History  Problem Relation Age of Onset   Hypertension Mother    Diabetes Father    Asthma Son    Allergies Son        peanuts    Social History   Socioeconomic History   Marital status: Single    Spouse name: Not on file   Number of children: 2   Years of education: 12   Highest education level: Bachelor's degree (e.g., BA, AB, BS)  Occupational History   Occupation: IT  Tobacco Use   Smoking status: Every Day    Packs/day: 1.00    Years: 12.00    Additional pack years: 0.00    Total pack years: 12.00    Types: Cigarettes   Smokeless tobacco: Never   Vaping  Use   Vaping Use: Never used  Substance and Sexual Activity   Alcohol use: Yes    Alcohol/week: 0.0 standard drinks of alcohol    Comment: occassional   Drug use: Yes    Types: Marijuana   Sexual activity: Yes  Other Topics Concern   Not on file  Social History Narrative   Not on file   Social Determinants of Health   Financial Resource Strain: Medium Risk (11/30/2022)   Overall Financial Resource Strain (CARDIA)    Difficulty of Paying Living Expenses: Somewhat hard  Food Insecurity: Food Insecurity Present (11/30/2022)   Hunger Vital Sign    Worried About Running Out of Food in the Last Year: Sometimes true    Ran Out of Food in the Last Year: Sometimes true  Transportation Needs: No Transportation Needs (11/30/2022)   PRAPARE - Administrator, Civil Service (Medical): No    Lack of Transportation (Non-Medical): No  Physical Activity: Sufficiently Active (11/30/2022)   Exercise Vital Sign    Days of Exercise per Week: 5 days    Minutes of Exercise per Session: 60 min  Stress: No Stress Concern Present (11/30/2022)   [employer] of Occupational Health - Occupational Stress Questionnaire    Feeling of Stress : Only a little  Recent Concern: Stress - Stress Concern Present (11/09/2022)   [employer] of Occupational Health - Occupational Stress Questionnaire    Feeling of Stress : To some extent  Social Connections: Moderately Isolated (11/30/2022)   Social Connection and Isolation Panel [NHANES]    Frequency of Communication with Friends and Family: More than three times a week    Frequency of Social Gatherings with Friends and Family: More than three times a week    Attends Religious Services: 1 to 4 times per year    Active Member of [employer] or Organizations: No    Attends Banker Meetings: Never    Marital Status: Never married  Intimate Partner Violence: Not At Risk (11/30/2022)   Humiliation, Afraid, Rape, and Kick questionnaire     Fear of Current or Ex-Partner: No    Emotionally Abused: No    Physically Abused: No    Sexually Abused: No     Current Outpatient Medications:    multivitamin (ONE-A-DAY MEN'S) TABS tablet, Take 1 tablet by mouth daily., Disp: , Rfl:    tadalafil (CIALIS) 20 MG tablet, Take 1 tablet 1 hour prior to intercourse., Disp: 18 tablet, Rfl: 0   nicotine (NICODERM CQ - DOSED IN MG/24 HOURS) 21 mg/24hr patch, Place 1 patch (21 mg total) onto the skin daily. (Patient not taking: Reported on 11/09/2022), Disp: 28 patch, Rfl: 0   Varenicline Tartrate, Starter, (CHANTIX STARTING MONTH PAK) 0.5 MG X 11 & 1 MG X 42 TBPK, Take 0.5 mg tablet by mouth 1x daily for 3 days, then increase to 0.5 mg tablet 2x daily for 4 days, then increase to 1 mg tablet 2x daily. (Patient not taking: Reported on 11/09/2022), Disp: 1 each, Rfl: 0  Allergies  Allergen Reactions   Amoxil [Amoxicillin] Rash     ROS  Constitutional: Negative for fever or weight change.  Respiratory: Negative for cough and shortness of breath.   Cardiovascular: Negative for chest pain or palpitations.  Gastrointestinal: Negative for abdominal pain, no bowel changes.  Musculoskeletal: Negative for gait problem or joint swelling.  Skin: Negative for rash.  Neurological: Negative for dizziness or headache.  No other specific complaints in a complete review  of systems (except as listed in HPI above).    Objective  Vitals:   11/30/22 1422  BP: 118/76  Pulse: 80  Resp: 18  Temp: 97.9 F (36.6 C)  TempSrc: Oral  SpO2: 94%  Weight: 179 lb (81.2 kg)  Height: 5\' 8"  (1.727 m)    Body mass index is 27.22 kg/m.  Physical Exam Constitutional: Patient appears well-developed and well-nourished. No distress.  HENT: Head: Normocephalic and atraumatic. Ears: B TMs ok, no erythema or effusion; Nose: Nose normal. Mouth/Throat: Oropharynx is clear and moist. No oropharyngeal exudate.  Eyes: Conjunctivae and EOM are normal. Pupils are equal, round,  and reactive to light. No scleral icterus.  Neck: Normal range of motion. Neck supple. No JVD present. No thyromegaly present.  Cardiovascular: Normal rate, regular rhythm and normal heart sounds.  No murmur heard. No BLE edema. Pulmonary/Chest: Effort normal and breath sounds normal. No respiratory distress. Abdominal: Soft. Bowel sounds are normal, no distension. There is no tenderness. no masses Musculoskeletal: Normal range of motion, no joint effusions. No gross deformities Neurological: he is alert and oriented to person, place, and time. No cranial nerve deficit. Coordination, balance, strength, speech and gait are normal.  Skin: Skin is warm and dry. No rash noted. No erythema.  Psychiatric: Patient has a normal mood and affect. behavior is normal. Judgment and thought content normal.   Recent Results (from the past 2160 hour(s))  Cytology (oral, anal, urethral) ancillary only     Status: None   Collection Time: 10/26/22 10:49 AM  Result Value Ref Range   Neisseria Gonorrhea Negative    Chlamydia Negative    Trichomonas Negative    Comment Normal Reference Range Trichomonas - Negative    Comment Normal Reference Ranger Chlamydia - Negative    Comment      Normal Reference Range Neisseria Gonorrhea - Negative  POCT urinalysis dipstick     Status: Abnormal   Collection Time: 10/26/22 11:09 AM  Result Value Ref Range   Color, UA yellow yellow   Clarity, UA cloudy (A) clear   Glucose, UA negative negative mg/dL   Bilirubin, UA small (A) negative   Ketones, POC UA negative negative mg/dL   Spec Grav, UA 4.098 1.191 - 1.025   Blood, UA trace-intact (A) negative   pH, UA 5.5 5.0 - 8.0   Protein Ur, POC negative negative mg/dL   Urobilinogen, UA 2.0 (A) 0.2 or 1.0 E.U./dL   Nitrite, UA Negative Negative   Leukocytes, UA Negative Negative  Urinalysis, Routine w reflex microscopic     Status: Abnormal   Collection Time: 11/09/22 12:18 PM  Result Value Ref Range   Color, Urine  DARK YELLOW YELLOW   APPearance CLEAR CLEAR   Specific Gravity, Urine 1.031 1.001 - 1.035   pH 5.5 5.0 - 8.0   Glucose, UA NEGATIVE NEGATIVE   Bilirubin Urine NEGATIVE NEGATIVE   Ketones, ur NEGATIVE NEGATIVE   Hgb urine dipstick NEGATIVE NEGATIVE   Protein, ur TRACE (A) NEGATIVE   Nitrite NEGATIVE NEGATIVE   Leukocytes,Ua NEGATIVE NEGATIVE   WBC, UA 0-5 0 - 5 /HPF   RBC / HPF 0-2 0 - 2 /HPF   Squamous Epithelial / HPF NONE SEEN < OR = 5 /HPF   Bacteria, UA NONE SEEN NONE SEEN /HPF   Hyaline Cast NONE SEEN NONE SEEN /LPF     Fall Risk:    11/30/2022    2:23 PM 11/09/2022   11:52 AM 06/01/2022    2:05  PM 08/17/2021   10:52 AM 08/03/2021    8:49 AM  Fall Risk   Falls in the past year? 0 0 0 0 0  Number falls in past yr: 0 0 0 0 0  Injury with Fall? 0 0 0 0 0  Follow up   Falls evaluation completed Falls evaluation completed Falls evaluation completed     Functional Status Survey: Is the patient deaf or have difficulty hearing?: No Does the patient have difficulty seeing, even when wearing glasses/contacts?: No Does the patient have difficulty concentrating, remembering, or making decisions?: No Does the patient have difficulty walking or climbing stairs?: No Does the patient have difficulty dressing or bathing?: No    Assessment & Plan  1. Physical exam, annual  - CBC with Differential/Platelet - COMPLETE METABOLIC PANEL WITH GFR - Lipid panel - Hemoglobin A1c - Hepatitis C antibody - HIV Antibody (routine testing w rflx)  2. Screening for diabetes mellitus  - COMPLETE METABOLIC PANEL WITH GFR - Hemoglobin A1c  3. Screening for deficiency anemia  - CBC with Differential/Platelet  4. Screening for cholesterol level  - Lipid panel  5. Mixed hyperlipidemia  - Lipid panel  6. Screening for HIV without presence of risk factors  - HIV Antibody (routine testing w rflx)  7. Encounter for hepatitis C screening test for low risk patient  - Hepatitis C  antibody  8. Moderate episode of recurrent major depressive disorder (HCC) No change  9. Family history of hemochromatosis  - Iron, TIBC and Ferritin Panel    -Prostate cancer screening and PSA options (with potential risks and benefits of testing vs not testing) were discussed along with recent recs/guidelines. -USPSTF grade A and B recommendations reviewed with patient; age-appropriate recommendations, preventive care, screening tests, etc discussed and encouraged; healthy living encouraged; see AVS for patient education given to patient -Discussed importance of 150 minutes of physical activity weekly, eat two servings of fish weekly, eat one serving of tree nuts ( cashews, pistachios, pecans, almonds.Marland Kitchen) every other day, eat 6 servings of fruit/vegetables daily and drink plenty of water and avoid sweet beverages.  -Reviewed Health Maintenance: yes

## 2022-11-30 ENCOUNTER — Ambulatory Visit (INDEPENDENT_AMBULATORY_CARE_PROVIDER_SITE_OTHER): Payer: Medicaid Other | Admitting: Nurse Practitioner

## 2022-11-30 ENCOUNTER — Encounter: Payer: Self-pay | Admitting: Nurse Practitioner

## 2022-11-30 ENCOUNTER — Other Ambulatory Visit: Payer: Self-pay

## 2022-11-30 VITALS — BP 118/76 | HR 80 | Temp 97.9°F | Resp 18 | Ht 68.0 in | Wt 179.0 lb

## 2022-11-30 DIAGNOSIS — Z8349 Family history of other endocrine, nutritional and metabolic diseases: Secondary | ICD-10-CM | POA: Diagnosis not present

## 2022-11-30 DIAGNOSIS — Z1159 Encounter for screening for other viral diseases: Secondary | ICD-10-CM | POA: Diagnosis not present

## 2022-11-30 DIAGNOSIS — E782 Mixed hyperlipidemia: Secondary | ICD-10-CM

## 2022-11-30 DIAGNOSIS — Z114 Encounter for screening for human immunodeficiency virus [HIV]: Secondary | ICD-10-CM | POA: Diagnosis not present

## 2022-11-30 DIAGNOSIS — Z131 Encounter for screening for diabetes mellitus: Secondary | ICD-10-CM | POA: Diagnosis not present

## 2022-11-30 DIAGNOSIS — F331 Major depressive disorder, recurrent, moderate: Secondary | ICD-10-CM

## 2022-11-30 DIAGNOSIS — Z0000 Encounter for general adult medical examination without abnormal findings: Secondary | ICD-10-CM | POA: Diagnosis not present

## 2022-11-30 DIAGNOSIS — Z13220 Encounter for screening for lipoid disorders: Secondary | ICD-10-CM

## 2022-11-30 DIAGNOSIS — Z130 Encounter for screening for diseases of the blood and blood-forming organs and certain disorders involving the immune mechanism: Secondary | ICD-10-CM

## 2022-11-30 LAB — CBC WITH DIFFERENTIAL/PLATELET
Hemoglobin: 16.9 g/dL (ref 13.2–17.1)
Lymphs Abs: 3060 cells/uL (ref 850–3900)
MCH: 33.4 pg — ABNORMAL HIGH (ref 27.0–33.0)
MCHC: 34.9 g/dL (ref 32.0–36.0)
MCV: 95.7 fL (ref 80.0–100.0)
MPV: 11.4 fL (ref 7.5–12.5)
Neutro Abs: 5960 cells/uL (ref 1500–7800)
RDW: 12.7 % (ref 11.0–15.0)

## 2022-12-01 LAB — CBC WITH DIFFERENTIAL/PLATELET
Absolute Monocytes: 640 cells/uL (ref 200–950)
Basophils Absolute: 40 cells/uL (ref 0–200)
Basophils Relative: 0.4 %
Eosinophils Absolute: 300 cells/uL (ref 15–500)
Eosinophils Relative: 3 %
HCT: 48.4 % (ref 38.5–50.0)
Monocytes Relative: 6.4 %
Neutrophils Relative %: 59.6 %
Platelets: 189 10*3/uL (ref 140–400)
RBC: 5.06 10*6/uL (ref 4.20–5.80)
Total Lymphocyte: 30.6 %
WBC: 10 10*3/uL (ref 3.8–10.8)

## 2022-12-01 LAB — IRON,TIBC AND FERRITIN PANEL
%SAT: 18 % (calc) — ABNORMAL LOW (ref 20–48)
Ferritin: 207 ng/mL (ref 38–380)
Iron: 69 ug/dL (ref 50–180)
TIBC: 391 mcg/dL (calc) (ref 250–425)

## 2022-12-01 LAB — COMPLETE METABOLIC PANEL WITH GFR
AG Ratio: 2 (calc) (ref 1.0–2.5)
ALT: 34 U/L (ref 9–46)
AST: 21 U/L (ref 10–40)
Albumin: 4.7 g/dL (ref 3.6–5.1)
Alkaline phosphatase (APISO): 101 U/L (ref 36–130)
BUN: 12 mg/dL (ref 7–25)
CO2: 21 mmol/L (ref 20–32)
Calcium: 9.3 mg/dL (ref 8.6–10.3)
Chloride: 109 mmol/L (ref 98–110)
Creat: 1.14 mg/dL (ref 0.60–1.26)
Globulin: 2.3 g/dL (calc) (ref 1.9–3.7)
Glucose, Bld: 86 mg/dL (ref 65–99)
Potassium: 3.8 mmol/L (ref 3.5–5.3)
Sodium: 140 mmol/L (ref 135–146)
Total Bilirubin: 0.7 mg/dL (ref 0.2–1.2)
Total Protein: 7 g/dL (ref 6.1–8.1)
eGFR: 87 mL/min/{1.73_m2} (ref >=60)

## 2022-12-01 LAB — LIPID PANEL
Cholesterol: 160 mg/dL (ref <200)
HDL: 21 mg/dL — ABNORMAL LOW (ref >=40)
LDL Cholesterol (Calc): 103 mg/dL (calc) — ABNORMAL HIGH
Non-HDL Cholesterol (Calc): 139 mg/dL (calc) — ABNORMAL HIGH (ref <130)
Total CHOL/HDL Ratio: 7.6 (calc) — ABNORMAL HIGH (ref <5.0)
Triglycerides: 236 mg/dL — ABNORMAL HIGH (ref <150)

## 2022-12-01 LAB — HEMOGLOBIN A1C
Hgb A1c MFr Bld: 5.2 % of total Hgb (ref <5.7)
Mean Plasma Glucose: 103 mg/dL
eAG (mmol/L): 5.7 mmol/L

## 2022-12-01 LAB — HIV ANTIBODY (ROUTINE TESTING W REFLEX): HIV 1&2 Ab, 4th Generation: NONREACTIVE

## 2022-12-01 LAB — HEPATITIS C ANTIBODY: Hepatitis C Ab: NONREACTIVE

## 2022-12-26 ENCOUNTER — Ambulatory Visit: Payer: Medicaid Other | Admitting: Urology

## 2022-12-26 ENCOUNTER — Encounter: Payer: Self-pay | Admitting: Urology

## 2023-01-01 ENCOUNTER — Other Ambulatory Visit: Payer: Self-pay

## 2023-01-01 ENCOUNTER — Emergency Department
Admission: EM | Admit: 2023-01-01 | Discharge: 2023-01-01 | Disposition: A | Discharge status: Home / Self Care | Payer: Medicaid Other | Attending: Emergency Medicine | Admitting: Emergency Medicine

## 2023-01-01 ENCOUNTER — Emergency Department: Payer: Medicaid Other

## 2023-01-01 ENCOUNTER — Encounter: Payer: Self-pay | Admitting: Emergency Medicine

## 2023-01-01 DIAGNOSIS — R079 Chest pain, unspecified: Secondary | ICD-10-CM | POA: Diagnosis not present

## 2023-01-01 DIAGNOSIS — Z20822 Contact with and (suspected) exposure to covid-19: Secondary | ICD-10-CM | POA: Insufficient documentation

## 2023-01-01 DIAGNOSIS — D72829 Elevated white blood cell count, unspecified: Secondary | ICD-10-CM | POA: Diagnosis not present

## 2023-01-01 DIAGNOSIS — B349 Viral infection, unspecified: Secondary | ICD-10-CM

## 2023-01-01 DIAGNOSIS — R0789 Other chest pain: Secondary | ICD-10-CM | POA: Diagnosis not present

## 2023-01-01 LAB — RESP PANEL BY RT-PCR (RSV, FLU A&B, COVID)  RVPGX2
Influenza A by PCR: NEGATIVE
Influenza B by PCR: NEGATIVE
Resp Syncytial Virus by PCR: NEGATIVE
SARS Coronavirus 2 by RT PCR: NEGATIVE

## 2023-01-01 LAB — CBC
HCT: 45.6 % (ref 39.0–52.0)
Hemoglobin: 16.2 g/dL (ref 13.0–17.0)
MCH: 33.4 pg (ref 26.0–34.0)
MCHC: 35.5 g/dL (ref 30.0–36.0)
MCV: 94 fL (ref 80.0–100.0)
Platelets: 183 10*3/uL (ref 150–400)
RBC: 4.85 MIL/uL (ref 4.22–5.81)
RDW: 12.3 % (ref 11.5–15.5)
WBC: 20.8 10*3/uL — ABNORMAL HIGH (ref 4.0–10.5)
nRBC: 0 % (ref 0.0–0.2)

## 2023-01-01 LAB — BASIC METABOLIC PANEL
Anion gap: 10 (ref 5–15)
BUN: 10 mg/dL (ref 6–20)
CO2: 25 mmol/L (ref 22–32)
Calcium: 8.9 mg/dL (ref 8.9–10.3)
Chloride: 103 mmol/L (ref 98–111)
Creatinine, Ser: 1.17 mg/dL (ref 0.61–1.24)
GFR, Estimated: >60 mL/min (ref >60)
Glucose, Bld: 119 mg/dL — ABNORMAL HIGH (ref 70–99)
Potassium: 3.2 mmol/L — ABNORMAL LOW (ref 3.5–5.1)
Sodium: 138 mmol/L (ref 135–145)

## 2023-01-01 LAB — TROPONIN I (HIGH SENSITIVITY): Troponin I (High Sensitivity): 5 ng/L (ref <18)

## 2023-01-01 MED ORDER — ACETAMINOPHEN 500 MG PO TABS
1000.0000 mg | ORAL_TABLET | Freq: Once | ORAL | Status: AC
  Administered 2023-01-01: 1000 mg via ORAL
  Filled 2023-01-01: qty 2

## 2023-01-01 MED ORDER — NAPROXEN 500 MG PO TABS
500.0000 mg | ORAL_TABLET | Freq: Once | ORAL | Status: AC
  Administered 2023-01-01: 500 mg via ORAL
  Filled 2023-01-01: qty 1

## 2023-01-01 MED ORDER — NAPROXEN 500 MG PO TABS: 500.0000 mg | ORAL_TABLET | Freq: Two times a day (BID) | ORAL | 0 refills | Status: AC

## 2023-01-01 NOTE — ED Notes (Signed)
Assumed care of pt at this time. Pt is AAXO4, on CCM, VS stable and WNL. Pt c/o ongoing CP. NAD at this time. No needs identified at this time.

## 2023-01-01 NOTE — ED Notes (Signed)
Provided pt with discharge instructions and education. All of pt questions answered. Pt AAOX4 and stable at time of discharge.Pt ambulated w/ steady gait towards ED exit.

## 2023-01-01 NOTE — ED Triage Notes (Signed)
Patient ambulatory to triage with steady gait, without difficulty or distress noted; pt reports since Sunday having left sided CP nonradiating accomp by "feeling tired"

## 2023-01-01 NOTE — Discharge Instructions (Addendum)
You were seen in the Emergency Department today for evaluation of your chest pain. Fortunately, your labs, EKG, and chest x-Diany Formosa were overall reassuring against a emergency cause for your pain.  You did have a fever here, and I suspect that you may have a viral illness causing your symptoms.  I sent a prescription for Naprosyn to your pharmacy that you can take to help with your chest pain.  You can also take Tylenol in addition to the for fever chest pain. Return to the ER for any new or worsening symptoms including worsening chest pain, difficulty breathing, or any other new or concerning symptoms that you believe warrants immediate attention.

## 2023-01-01 NOTE — ED Provider Notes (Signed)
[facility] Provider Note    Event Date/Time   First MD Initiated Contact with Patient 01/01/23 0423     (approximate)   History   Chest Pain   HPI  Douglas Greer is a 32 y.o. male presenting to the ER for evaluation of chest pain.  On Sunday, patient began to have a cough with associated subjective fevers and left-sided chest pain.  Described as a sharp sensation, nonradiating.  Denies significant shortness of breath.  No history of similar.  No known sick contacts.  No vomiting, diarrhea, abdominal pain, dysuria.    Physical Exam   Triage Vital Signs: ED Triage Vitals  Encounter Vitals Group     BP 01/01/23 0149 (!) 131/94     Systolic BP Percentile --      Diastolic BP Percentile --      Pulse Rate 01/01/23 0149 99     Resp 01/01/23 0149 19     Temp 01/01/23 0149 (!) 101.1 F (38.4 C)     Temp Source 01/01/23 0149 Oral     SpO2 01/01/23 0149 98 %     Weight 01/01/23 0143 180 lb (81.6 kg)     Height 01/01/23 0143 5\' 8"  (1.727 m)     Head Circumference --      Peak Flow --      Pain Score 01/01/23 0143 5     Pain Loc --      Pain Education --      Exclude from Growth Chart --     Most recent vital signs: Vitals:   01/01/23 0500 01/01/23 0515  BP: 127/81   Pulse: 100 81  Resp: 18 (!) 21  Temp:    SpO2: 100% 98%     General: Awake, interactive  CV:  Regular rate, good peripheral perfusion. Chest wall: Reproducible tenderness palpation over the left anterior chest wall Resp:  Lungs clear, unlabored respirations.  Abd:  Soft, nondistended.  Neuro:  Symmetric facial movement, fluid speech   ED Results / Procedures / Treatments   Labs (all labs ordered are listed, but only abnormal results are displayed) Labs Reviewed  CBC - Abnormal; Notable for the following components:      Result Value   WBC 20.8 (*)    All other components within normal limits  BASIC METABOLIC PANEL - Abnormal; Notable for the following components:    Potassium 3.2 (*)    Glucose, Bld 119 (*)    All other components within normal limits  RESP PANEL BY RT-PCR (RSV, FLU A&B, COVID)  [id number]  TROPONIN I (HIGH SENSITIVITY)     EKG EKG independently reviewed interpreted by myself (ER attending) demonstrates:  EKG demonstrates sinus tachycardia at a rate of 103, PR 118, QRS 82, QTc 411, no acute ST changes  RADIOLOGY Imaging independently reviewed and interpreted by myself demonstrates:  Chest xray without focal consolidation  PROCEDURES:  Critical Care performed: No  Procedures   MEDICATIONS ORDERED IN ED: Medications  naproxen (NAPROSYN) tablet 500 mg (has no administration in time range)  acetaminophen (TYLENOL) tablet 1,000 mg (1,000 mg Oral Given 01/01/23 0158)     IMPRESSION / MDM / ASSESSMENT AND PLAN / ED COURSE  I reviewed the triage vital signs and the nursing notes.  Differential diagnosis includes, but is not limited to, pneumonia, viral illness, musculoskeletal chest pain including costochondritis, pleurisy, will much lower suspicion ACS, low risk PE and PERC negative  Patient's presentation is most consistent with acute  presentation with potential threat to life or bodily function.  32 year old male presented to the emergency department for evaluation of chest pain.  Blood work does demonstrate significant leukocytosis with WBC of 20.8.  Patient is febrile on presentation here without tachycardia.  His x-Taiki Buckwalter is without evidence of pneumonia.  Troponin negative.  EKG reassuring.  Viral panel is negative, but given consolation of symptoms, suspect likely viral illness with some component of musculoskeletal chest pain.  Clinical history not suggestive of alternative infectious source.  Patient was treated with Tylenol and Naprosyn here.  He did report improvement in his pain.  He is comfortable discharge home and outpatient follow-up.  Will DC with course of naproxen.  Strict return precautions provided.  Patient  discharged stable condition.     FINAL CLINICAL IMPRESSION(S) / ED DIAGNOSES   Final diagnoses:  Acute chest pain  Nonspecific syndrome suggestive of viral illness     Rx / DC Orders   ED Discharge Orders          Ordered    naproxen (NAPROSYN) 500 MG tablet  2 times daily with meals        01/01/23 0618             Note:  This document was prepared using Dragon voice recognition software and may include unintentional dictation errors.   Trinna Post, MD 01/01/23 [phone]

## 2023-01-03 ENCOUNTER — Ambulatory Visit
Admission: RE | Admit: 2023-01-03 | Discharge: 2023-01-03 | Disposition: A | Discharge status: Home / Self Care | Payer: Medicaid Other | Source: Ambulatory Visit | Attending: Emergency Medicine | Admitting: Emergency Medicine

## 2023-01-03 VITALS — BP 135/79 | HR 74 | Temp 98.1°F | Resp 18

## 2023-01-03 DIAGNOSIS — J029 Acute pharyngitis, unspecified: Secondary | ICD-10-CM | POA: Insufficient documentation

## 2023-01-03 DIAGNOSIS — B349 Viral infection, unspecified: Secondary | ICD-10-CM | POA: Diagnosis present

## 2023-01-03 DIAGNOSIS — F17200 Nicotine dependence, unspecified, uncomplicated: Secondary | ICD-10-CM | POA: Insufficient documentation

## 2023-01-03 LAB — POCT RAPID STREP A (OFFICE): Rapid Strep A Screen: NEGATIVE

## 2023-01-03 NOTE — ED Provider Notes (Signed)
[name]    CSN: [id number] Arrival date & time: 01/03/23  1330      History   Chief Complaint Chief Complaint  Patient presents with   Sore Throat    Entered by patient    HPI Douglas Greer is a 32 y.o. male.   32 year old male pt, Douglas Greer, presents to urgent care for sore throat that started yesterday. Pt was seen in Er 2 day sprior for URi type symptoms and advised he had a viral infection. P tis taking Naproxen for symptoms management.  Pt endorses smoking.  The history is provided by the patient. No language interpreter was used.    Past Medical History:  Diagnosis Date   Colitis presumed infectious 12/24/2018   Elevated hemoglobin (HCC) 06/21/2017   High hematocrit 06/21/2017   Low HDL (under 40) 11/26/2015   Neck arthritis 09/27/2017   Tobacco use 11/27/2015   Verruca plana 11/25/2015    Patient Active Problem List   Diagnosis Date Noted   Nonspecific syndrome suggestive of viral illness 01/03/2023   Acute pharyngitis 01/03/2023   Moderate episode of recurrent major depressive disorder (HCC) 06/01/2022   Anxiety 06/01/2022   Mixed hyperlipidemia 09/04/2019   Spontaneous pneumothorax 08/25/2019   Smoker 08/25/2019   Elevated serum glutamic pyruvic transaminase (SGPT) level 08/12/2018   Spondylosis without myelopathy or radiculopathy, cervical region 04/10/2018   Overweight 01/24/2018   Genital warts 10/04/2016   Tobacco use 11/27/2015   Total bilirubin, elevated 11/26/2015   Low HDL (under 40) 11/26/2015    Past Surgical History:  Procedure Laterality Date   INCISIONAL HERNIA REPAIR     NASAL FRACTURE SURGERY         Home Medications    Prior to Admission medications   Medication Sig Start Date End Date Taking? Authorizing Provider  multivitamin (ONE-A-DAY MEN'S) TABS tablet Take 1 tablet by mouth daily.    [provider]  naproxen (NAPROSYN) 500 MG tablet Take 1 tablet (500 mg total) by mouth 2 (two) times  daily with a meal for 10 days. 01/01/23 01/11/23  Trinna Post, MD  nicotine (NICODERM CQ - DOSED IN MG/24 HOURS) 21 mg/24hr patch Place 1 patch (21 mg total) onto the skin daily. Patient not taking: Reported on 11/09/2022 06/01/22   Berniece Salines, FNP  tadalafil (CIALIS) 20 MG tablet Take 1 tablet 1 hour prior to intercourse. 11/14/22   Stoioff, Verna Czech, MD  Varenicline Tartrate, Starter, (CHANTIX STARTING MONTH PAK) 0.5 MG X 11 & 1 MG X 42 TBPK Take 0.5 mg tablet by mouth 1x daily for 3 days, then increase to 0.5 mg tablet 2x daily for 4 days, then increase to 1 mg tablet 2x daily. Patient not taking: Reported on 11/09/2022 06/01/22   Berniece Salines, FNP    Family History Family History  Problem Relation Age of Onset   Hypertension Mother    Diabetes Father    Asthma Son    Allergies Son        peanuts    Social History Social History   Tobacco Use   Smoking status: Every Day    Current packs/day: 1.00    Average packs/day: 1 pack/day for 12.0 years (12.0 ttl pk-yrs)    Types: Cigarettes   Smokeless tobacco: Never  Vaping Use   Vaping status: Never Used  Substance Use Topics   Alcohol use: Yes    Alcohol/week: 0.0 standard drinks of alcohol    Comment: occassional  Drug use: Yes    Types: Marijuana     Allergies   Amoxil [amoxicillin]   Review of Systems Review of Systems  HENT:  Positive for congestion and sore throat.   All other systems reviewed and are negative.    Physical Exam Triage Vital Signs ED Triage Vitals  Encounter Vitals Group     BP 01/03/23 1339 135/79     Systolic BP Percentile --      Diastolic BP Percentile --      Pulse Rate 01/03/23 1339 74     Resp 01/03/23 1339 18     Temp 01/03/23 1339 98.1 F (36.7 C)     Temp src --      SpO2 01/03/23 1339 98 %     Weight --      Height --      Head Circumference --      Peak Flow --      Pain Score 01/03/23 1344 5     Pain Loc --      Pain Education --      Exclude from Growth Chart --     No data found.  Updated Vital Signs BP 135/79   Pulse 74   Temp 98.1 F (36.7 C)   Resp 18   SpO2 98%   Visual Acuity Right Eye Distance:   Left Eye Distance:   Bilateral Distance:    Right Eye Near:   Left Eye Near:    Bilateral Near:     Physical Exam Vitals and nursing note reviewed.  Constitutional:      Appearance: Normal appearance. He is well-developed and well-groomed.  HENT:     Head: Normocephalic.     Right Ear: Tympanic membrane is retracted.     Left Ear: Tympanic membrane is retracted.     Nose: Congestion present.     Mouth/Throat:     Lips: Pink.     Mouth: Mucous membranes are moist.     Pharynx: Oropharynx is clear.  Cardiovascular:     Rate and Rhythm: Normal rate and regular rhythm.     Pulses: Normal pulses.     Heart sounds: Normal heart sounds.  Pulmonary:     Effort: Pulmonary effort is normal.     Breath sounds: Normal breath sounds and air entry.  Neurological:     General: No focal deficit present.     Mental Status: He is alert and oriented to person, place, and time.     GCS: GCS eye subscore is 4. GCS verbal subscore is 5. GCS motor subscore is 6.  Psychiatric:        Attention and Perception: Attention normal.        Mood and Affect: Mood normal.        Speech: Speech normal.        Behavior: Behavior normal. Behavior is cooperative.      UC Treatments / Results  Labs (all labs ordered are listed, but only abnormal results are displayed) Labs Reviewed  CULTURE, GROUP A STREP [facility])  POCT RAPID STREP A (OFFICE)    EKG   Radiology No results found.  Procedures Procedures (including critical care time)  Medications Ordered in UC Medications - No data to display  Initial Impression / Assessment and Plan / UC Course  I have reviewed the triage vital signs and the nursing notes.  Pertinent labs & imaging results that were available during my care of the patient were reviewed by me  and considered in my medical  decision making (see chart for details).  Clinical Course as of 01/03/23 1535  Thu Jan 03, 2023  1419 Strep negative [JD]    Clinical Course User Index [JD] [place] Gustafson, Para March, NP    Ddx: Pharyngitis, viral illness, allergies Final Clinical Impressions(s) / UC Diagnoses   Final diagnoses:  Nonspecific syndrome suggestive of viral illness  Acute pharyngitis, unspecified etiology  Smoker     Discharge Instructions      Most likely you have a viral illness: no antibiotic as indicated at this time, May treat with OTC meds of choice. Make sure to drink plenty of fluids to stay hydrated(gatorade, water, popsicles,jello,etc), avoid caffeine products. Follow up with PCP. Return as needed.     ED Prescriptions   None    PDMP not reviewed this encounter.   Clancy Gourd, NP 01/03/23 1535

## 2023-01-03 NOTE — ED Triage Notes (Signed)
Patient to Urgent Care with complaints of sore throat that started yesterday.  Fevers that started 7/30. Has been taking naproxen.

## 2023-01-03 NOTE — Discharge Instructions (Addendum)
Most likely you have a viral illness: no antibiotic as indicated at this time, May treat with OTC meds of choice. Make sure to drink plenty of fluids to stay hydrated(gatorade, water, popsicles,jello,etc), avoid caffeine products. Follow up with PCP. Return as needed. 

## 2023-01-10 NOTE — Progress Notes (Signed)
Established Patient Office Visit  Subjective   Patient ID: Douglas Greer, male    DOB: 1990/08/11  Age: 32 y.o. MRN: [id number]  Chief Complaint  Patient presents with   Follow-up    ER f/u    HPI  Patient presenting for multiple ER follow up's. Patient is new to me. Was seen in the [facility] ER 01/01/23 for chest pain. Was febrile at the time, WBC 20.8, glucose 119, potassium 3.2. EKG sinus tachycardia without ST wave changes. Chest x-ray negative. Troponin negative. Viral panel negative but presentation consistent with viral illness, treated conservatively with anti-inflammatories and was discharged home.   Was then seen at Urgent Care 01/03/23 for sore throat. Rapid strep and throat culture negative, again advised symptoms were most likely viral. Today he states his symptoms have completely resolved and he is feeling well. No fevers, sore throat sinus pain or ear pain.    Patient Active Problem List   Diagnosis Date Noted   Nonspecific syndrome suggestive of viral illness 01/03/2023   Acute pharyngitis 01/03/2023   Moderate episode of recurrent major depressive disorder (HCC) 06/01/2022   Anxiety 06/01/2022   Mixed hyperlipidemia 09/04/2019   Spontaneous pneumothorax 08/25/2019   Smoker 08/25/2019   Elevated serum glutamic pyruvic transaminase (SGPT) level 08/12/2018   Spondylosis without myelopathy or radiculopathy, cervical region 04/10/2018   Overweight 01/24/2018   Genital warts 10/04/2016   Tobacco use 11/27/2015   Total bilirubin, elevated 11/26/2015   Low HDL (under 40) 11/26/2015   Past Medical History:  Diagnosis Date   Colitis presumed infectious 12/24/2018   Elevated hemoglobin (HCC) 06/21/2017   High hematocrit 06/21/2017   Low HDL (under 40) 11/26/2015   Neck arthritis 09/27/2017   Tobacco use 11/27/2015   Verruca plana 11/25/2015   Past Surgical History:  Procedure Laterality Date   INCISIONAL HERNIA REPAIR     NASAL FRACTURE SURGERY     Social History    Tobacco Use   Smoking status: Every Day    Current packs/day: 1.00    Average packs/day: 1 pack/day for 12.0 years (12.0 ttl pk-yrs)    Types: Cigarettes   Smokeless tobacco: Never  Vaping Use   Vaping status: Never Used  Substance Use Topics   Alcohol use: Yes    Alcohol/week: 0.0 standard drinks of alcohol    Comment: occassional   Drug use: Yes    Types: Marijuana   Social History   Socioeconomic History   Marital status: Single    Spouse name: Not on file   Number of children: 2   Years of education: 12   Highest education level: Bachelor's degree (e.g., BA, AB, BS)  Occupational History   Occupation: IT  Tobacco Use   Smoking status: Every Day    Current packs/day: 1.00    Average packs/day: 1 pack/day for 12.0 years (12.0 ttl pk-yrs)    Types: Cigarettes   Smokeless tobacco: Never  Vaping Use   Vaping status: Never Used  Substance and Sexual Activity   Alcohol use: Yes    Alcohol/week: 0.0 standard drinks of alcohol    Comment: occassional   Drug use: Yes    Types: Marijuana   Sexual activity: Yes  Other Topics Concern   Not on file  Social History Narrative   Not on file   Social Determinants of Health   Financial Resource Strain: Medium Risk (11/30/2022)   Overall Financial Resource Strain (CARDIA)    Difficulty of Paying Living Expenses: Somewhat hard  Food  Insecurity: Food Insecurity Present (11/30/2022)   Hunger Vital Sign    Worried About Running Out of Food in the Last Year: Sometimes true    Ran Out of Food in the Last Year: Sometimes true  Transportation Needs: No Transportation Needs (11/30/2022)   PRAPARE - Administrator, Civil Service (Medical): No    Lack of Transportation (Non-Medical): No  Physical Activity: Sufficiently Active (11/30/2022)   Exercise Vital Sign    Days of Exercise per Week: 5 days    Minutes of Exercise per Session: 60 min  Stress: No Stress Concern Present (11/30/2022)   [employer] of Occupational  Health - Occupational Stress Questionnaire    Feeling of Stress : Only a little  Recent Concern: Stress - Stress Concern Present (11/09/2022)   [employer] of Occupational Health - Occupational Stress Questionnaire    Feeling of Stress : To some extent  Social Connections: Moderately Isolated (11/30/2022)   Social Connection and Isolation Panel [NHANES]    Frequency of Communication with Friends and Family: More than three times a week    Frequency of Social Gatherings with Friends and Family: More than three times a week    Attends Religious Services: 1 to 4 times per year    Active Member of [employer] or Organizations: No    Attends Banker Meetings: Never    Marital Status: Never married  Intimate Partner Violence: Not At Risk (11/30/2022)   Humiliation, Afraid, Rape, and Kick questionnaire    Fear of Current or Ex-Partner: No    Emotionally Abused: No    Physically Abused: No    Sexually Abused: No   Family Status  Relation Name Status   Mother  Alive   Father  Deceased   Daughter 1 Alive   Son 1 Alive  No partnership data on file   Family History  Problem Relation Age of Onset   Hypertension Mother    Diabetes Father    Asthma Son    Allergies Son        peanuts   Allergies  Allergen Reactions   Amoxil [Amoxicillin] Rash      Review of Systems  Constitutional:  Negative for chills and fever.  HENT:  Negative for congestion, ear pain, sinus pain and sore throat.   Respiratory:  Negative for cough, shortness of breath and wheezing.   Cardiovascular:  Negative for chest pain.  Gastrointestinal:  Negative for abdominal pain, diarrhea and nausea.      Objective:     BP 122/76   Pulse 82   Temp 97.9 F (36.6 C)   Resp 18   Ht 5\' 8"  (1.727 m)   Wt 172 lb 9.6 oz (78.3 kg)   SpO2 95%   BMI 26.24 kg/m  BP Readings from Last 3 Encounters:  01/14/23 122/76  01/03/23 135/79  01/01/23 124/70   Wt Readings from Last 3 Encounters:  01/14/23 172 lb  9.6 oz (78.3 kg)  01/01/23 180 lb (81.6 kg)  11/30/22 179 lb (81.2 kg)      Physical Exam Constitutional:      Appearance: Normal appearance.  HENT:     Head: Normocephalic and atraumatic.     Right Ear: Tympanic membrane, ear canal and external ear normal.     Left Ear: Tympanic membrane, ear canal and external ear normal.     Mouth/Throat:     Mouth: Mucous membranes are moist.     Pharynx: Oropharynx is clear.  Eyes:  Extraocular Movements: Extraocular movements intact.     Conjunctiva/sclera: Conjunctivae normal.     Pupils: Pupils are equal, round, and reactive to light.  Cardiovascular:     Rate and Rhythm: Normal rate and regular rhythm.  Pulmonary:     Effort: Pulmonary effort is normal.     Breath sounds: Normal breath sounds.  Skin:    General: Skin is warm and dry.     Comments: Small keloid like lesion behind left ear  Neurological:     General: No focal deficit present.     Mental Status: He is alert. Mental status is at baseline.  Psychiatric:        Mood and Affect: Mood normal.        Behavior: Behavior normal.     No results found for any visits on 01/14/23.  Last CBC Lab Results  Component Value Date   WBC 20.8 (H) 01/01/2023   HGB 16.2 01/01/2023   HCT 45.6 01/01/2023   MCV 94.0 01/01/2023   MCH 33.4 01/01/2023   RDW 12.3 01/01/2023   PLT 183 01/01/2023   Last metabolic panel Lab Results  Component Value Date   GLUCOSE 119 (H) 01/01/2023   NA 138 01/01/2023   K 3.2 (L) 01/01/2023   CL 103 01/01/2023   CO2 25 01/01/2023   BUN 10 01/01/2023   CREATININE 1.17 01/01/2023   GFRNONAA >60 01/01/2023   CALCIUM 8.9 01/01/2023   PROT 7.0 11/30/2022   ALBUMIN 3.9 12/25/2018   BILITOT 0.7 11/30/2022   ALKPHOS 44 12/25/2018   AST 21 11/30/2022   ALT 34 11/30/2022   ANIONGAP 10 01/01/2023   Last lipids Lab Results  Component Value Date   CHOL 160 11/30/2022   HDL 21 (L) 11/30/2022   LDLCALC 103 (H) 11/30/2022   TRIG 236 (H)  11/30/2022   CHOLHDL 7.6 (H) 11/30/2022   Last hemoglobin A1c Lab Results  Component Value Date   HGBA1C 5.2 11/30/2022   Last thyroid functions Lab Results  Component Value Date   TSH 2.79 08/03/2021   Last vitamin D No results found for: "25OHVITD2", "25OHVITD3", "VD25OH" Last vitamin B12 and Folate No results found for: "VITAMINB12", "FOLATE"    The ASCVD Risk score (Arnett DK, et al., 2019) failed to calculate for the following reasons:   The 2019 ASCVD risk score is only valid for ages 62 to 77    Assessment & Plan:   1. Viral upper respiratory tract infection: Reviewed ER note, labs and imaging from 01/01/23, reviewed urgent care note from 01/03/23 as well. Today patient states his symptoms have completely resolved, symptoms consistent with viral URI. No need to recheck labs, patient just had physical in June. Physical exam benign today. Will follow up next summer for physical or sooner as needed.    Return in about 11 months (around 12/14/2023) for CPE after 6/28.    Margarita Mail, DO

## 2023-01-14 ENCOUNTER — Encounter: Payer: Self-pay | Admitting: Internal Medicine

## 2023-01-14 ENCOUNTER — Ambulatory Visit (INDEPENDENT_AMBULATORY_CARE_PROVIDER_SITE_OTHER): Payer: Medicaid Other | Admitting: Internal Medicine

## 2023-01-14 VITALS — BP 122/76 | HR 82 | Temp 97.9°F | Resp 18 | Ht 68.0 in | Wt 172.6 lb

## 2023-01-14 DIAGNOSIS — J069 Acute upper respiratory infection, unspecified: Secondary | ICD-10-CM | POA: Diagnosis not present

## 2023-01-15 DIAGNOSIS — S39012A Strain of muscle, fascia and tendon of lower back, initial encounter: Secondary | ICD-10-CM | POA: Diagnosis not present

## 2023-03-17 ENCOUNTER — Telehealth: Payer: Medicaid Other | Admitting: Nurse Practitioner

## 2023-03-17 DIAGNOSIS — K599 Functional intestinal disorder, unspecified: Secondary | ICD-10-CM

## 2023-03-17 NOTE — Progress Notes (Signed)
Virtual Visit Consent   Douglas Greer, you are scheduled for a virtual visit with a [facility] provider today. Just as with appointments in the office, your consent must be obtained to participate. Your consent will be active for this visit and any virtual visit you may have with one of our providers in the next 365 days. If you have a MyChart account, a copy of this consent can be sent to you electronically.  As this is a virtual visit, video technology does not allow for your provider to perform a traditional examination. This may limit your provider's ability to fully assess your condition. If your provider identifies any concerns that need to be evaluated in person or the need to arrange testing (such as labs, EKG, etc.), we will make arrangements to do so. Although advances in technology are sophisticated, we cannot ensure that it will always work on either your end or our end. If the connection with a video visit is poor, the visit may have to be switched to a telephone visit. With either a video or telephone visit, we are not always able to ensure that we have a secure connection.  By engaging in this virtual visit, you consent to the provision of healthcare and authorize for your insurance to be billed (if applicable) for the services provided during this visit. Depending on your insurance coverage, you may receive a charge related to this service.  I need to obtain your verbal consent now. Are you willing to proceed with your visit today? Douglas Greer has provided verbal consent on 03/17/2023 for a virtual visit (video or telephone). Claiborne Rigg, NP  Date: 03/17/2023 7:52 PM  Virtual Visit via Video Note   I, Claiborne Rigg, connected with  Douglas Greer  ([id number], 1991/01/05) on 03/17/23 at  7:45 PM EDT by a video-enabled telemedicine application and verified that I am speaking with the correct person using two identifiers.  Location: Patient: Virtual Visit  Location Patient: Home Provider: Virtual Visit Location Provider: Home Office   I discussed the limitations of evaluation and management by telemedicine and the availability of in person appointments. The patient expressed understanding and agreed to proceed.    History of Present Illness: Douglas Greer is a 32 y.o. who identifies as a male who was assigned male at birth, and is being seen today for .  "My gas has a foul smell. Can I get something to clean me out". Mr. Drummond states he would like a medication to clean out his intestines. He denies constipation/N/V/D, hematochezia or melena.    Problems:  Patient Active Problem List   Diagnosis Date Noted   Nonspecific syndrome suggestive of viral illness 01/03/2023   Acute pharyngitis 01/03/2023   Moderate episode of recurrent major depressive disorder (HCC) 06/01/2022   Anxiety 06/01/2022   Mixed hyperlipidemia 09/04/2019   Spontaneous pneumothorax 08/25/2019   Smoker 08/25/2019   Elevated serum glutamic pyruvic transaminase (SGPT) level 08/12/2018   Spondylosis without myelopathy or radiculopathy, cervical region 04/10/2018   Overweight 01/24/2018   Genital warts 10/04/2016   Tobacco use 11/27/2015   Total bilirubin, elevated 11/26/2015   Low HDL (under 40) 11/26/2015    Allergies:  Allergies  Allergen Reactions   Amoxil [Amoxicillin] Rash   Medications:  Current Outpatient Medications:    multivitamin (ONE-A-DAY MEN'S) TABS tablet, Take 1 tablet by mouth daily., Disp: , Rfl:    tadalafil (CIALIS) 20 MG tablet, Take 1 tablet 1 hour prior to intercourse., Disp: 18 tablet,  Rfl: 0  Observations/Objective: Patient is well-developed, well-nourished in no acute distress.  Resting comfortably at home.  Head is normocephalic, atraumatic.  No labored breathing.  Speech is clear and coherent with logical content.  Patient is alert and oriented at baseline.    Assessment and Plan: 1. Bowel dysfunction Try probiotics  for at least 3 months Fruit/vegetable cleanse for 2 weeks. Stop eating red meat  Follow Up Instructions: I discussed the assessment and treatment plan with the patient. The patient was provided an opportunity to ask questions and all were answered. The patient agreed with the plan and demonstrated an understanding of the instructions.  A copy of instructions were sent to the patient via MyChart unless otherwise noted below.    The patient was advised to call back or seek an in-person evaluation if the symptoms worsen or if the condition fails to improve as anticipated.    Claiborne Rigg, NP

## 2023-03-17 NOTE — Patient Instructions (Signed)
  Leta Speller, thank you for joining Claiborne Rigg, NP for today's virtual visit.  While this provider is not your primary care provider (PCP), if your PCP is located in our provider database this encounter information will be shared with them immediately following your visit.   A [facility] MyChart account gives you access to today's visit and all your visits, tests, and labs performed at [facility] " click here if you don't have a [facility] MyChart account or go to [email or web address]  Consent: (Patient) Regino Fournet provided verbal consent for this virtual visit at the beginning of the encounter.  Current Medications:  Current Outpatient Medications:    multivitamin (ONE-A-DAY MEN'S) TABS tablet, Take 1 tablet by mouth daily., Disp: , Rfl:    tadalafil (CIALIS) 20 MG tablet, Take 1 tablet 1 hour prior to intercourse., Disp: 18 tablet, Rfl: 0   Medications ordered in this encounter:  No orders of the defined types were placed in this encounter.    *If you need refills on other medications prior to your next appointment, please contact your pharmacy*  Follow-Up: Call back or seek an in-person evaluation if the symptoms worsen or if the condition fails to improve as anticipated.  [facility] [phone]  Other Instructions Try probiotics for at least 3 months Fruit/vegetable cleanse for 2 weeks. Stop eating red meat   If you have been instructed to have an in-person evaluation today at a local Urgent Care facility, please use the link below. It will take you to a list of all of our available [facility], including address, phone number and hours of operation. Please do not delay care.  [facility]  If you or a family member do not have a primary care provider, use the link below to schedule a visit and establish care. When you choose a [facility] primary care physician or advanced practice provider, you  gain a long-term partner in health. Find a Primary Care Provider  Learn more about [facility]'s in-office and virtual care options: [facility] - Get Care Now

## 2023-08-08 ENCOUNTER — Ambulatory Visit

## 2023-08-09 ENCOUNTER — Encounter: Payer: Self-pay | Admitting: Nurse Practitioner

## 2023-08-09 ENCOUNTER — Ambulatory Visit: Admitting: Nurse Practitioner

## 2023-08-09 VITALS — BP 122/70 | HR 97 | Temp 98.2°F | Resp 16 | Ht 68.0 in | Wt 179.8 lb

## 2023-08-09 DIAGNOSIS — B079 Viral wart, unspecified: Secondary | ICD-10-CM | POA: Diagnosis not present

## 2023-08-09 NOTE — Progress Notes (Signed)
 BP 122/70   Pulse 97   Temp 98.2 F (36.8 C)   Resp 16   Ht 5\' 8"  (1.727 m)   Wt 179 lb 12.8 oz (81.6 kg)   SpO2 98%   BMI 27.34 kg/m    Subjective:    Patient ID: Douglas Greer, male    DOB: December 23, 1990, 33 y.o.   MRN: [id number]  HPI: Douglas Greer is a 33 y.o. male  Chief Complaint  Patient presents with   Warts    On finger  Patient presents to clinic with complaints of a single wart on left hand middle finger that was first noticed 3 days ago. Patient reports that clear fluid drained from wart when he squeezed it yesterday. Denies pain, itching, or bleeding. Patient states that he has not had a similar skin issue in the past.     01/14/2023    1:10 PM 11/30/2022    2:33 PM 11/30/2022    2:23 PM  Depression screen PHQ 2/9  Decreased Interest 0 0 0  Down, Depressed, Hopeless 0 0 0  PHQ - 2 Score 0 0 0  Altered sleeping 0 2   Tired, decreased energy 0 1   Change in appetite 0 0   Feeling bad or failure about yourself  0 1   Trouble concentrating 0 1   Moving slowly or fidgety/restless 0 0   Suicidal thoughts 0 0   PHQ-9 Score 0 5   Difficult doing work/chores Not difficult at all Not difficult at all     Relevant past medical, surgical, family and social history reviewed and updated as indicated. Interim medical history since our last visit reviewed. Allergies and medications reviewed and updated.  Review of Systems  Skin: small, flat, round lesion on left middle finger noted. No pain or tenderness with palpation, no signs of infection or redness in surrounding area, no bleeding/drainage noted  Ten systems reviewed and is negative except as mentioned in HPI  Per HPI unless specifically indicated above     Objective:    BP 122/70   Pulse 97   Temp 98.2 F (36.8 C)   Resp 16   Ht 5\' 8"  (1.727 m)   Wt 179 lb 12.8 oz (81.6 kg)   SpO2 98%   BMI 27.34 kg/m    Wt Readings from Last 3 Encounters:  08/09/23 179 lb 12.8 oz (81.6 kg)  01/14/23 172  lb 9.6 oz (78.3 kg)  01/01/23 180 lb (81.6 kg)    Physical Exam Vitals reviewed.  Constitutional:      Appearance: Normal appearance.  HENT:     Head: Normocephalic.  Cardiovascular:     Rate and Rhythm: Normal rate and regular rhythm.  Pulmonary:     Effort: Pulmonary effort is normal.     Breath sounds: Normal breath sounds.  Musculoskeletal:        General: Normal range of motion.  Skin:    General: Skin is warm and dry.     Findings: Lesion present.  Neurological:     General: No focal deficit present.     Mental Status: He is alert and oriented to person, place, and time. Mental status is at baseline.  Psychiatric:        Mood and Affect: Mood normal.        Behavior: Behavior normal.        Thought Content: Thought content normal.        Judgment: Judgment normal.  Results for orders placed or performed during the hospital encounter of 01/03/23  POCT rapid strep A   Collection Time: 01/03/23  1:55 PM  Result Value Ref Range   Rapid Strep A Screen Negative Negative  Culture, group A strep   Collection Time: 01/03/23  2:50 PM   Specimen: Throat  Result Value Ref Range   Specimen Description THROAT    Special Requests NONE    Culture      NO GROUP A STREP (S.PYOGENES) ISOLATED Performed at [facility], [address], [place], [place] [address]    Report Status 01/06/2023 FINAL        Assessment & Plan:   Problem List Items Addressed This Visit   None Visit Diagnoses       Wart of hand    -  Primary   referral to dermatology placed, and advised to use OTC wart removal   Relevant Orders   Ambulatory referral to Dermatology            Follow up plan: Return if symptoms worsen or fail to improve.

## 2023-08-14 DIAGNOSIS — H5213 Myopia, bilateral: Secondary | ICD-10-CM | POA: Diagnosis not present

## 2023-08-15 DIAGNOSIS — H5213 Myopia, bilateral: Secondary | ICD-10-CM | POA: Diagnosis not present

## 2023-09-30 ENCOUNTER — Other Ambulatory Visit: Payer: Self-pay | Admitting: Urology

## 2023-10-01 MED ORDER — TADALAFIL 20 MG PO TABS: ORAL_TABLET | ORAL | 0 refills | Status: AC

## 2023-10-03 ENCOUNTER — Ambulatory Visit

## 2023-10-03 DIAGNOSIS — R369 Urethral discharge, unspecified: Secondary | ICD-10-CM | POA: Diagnosis not present

## 2023-10-03 DIAGNOSIS — Z113 Encounter for screening for infections with a predominantly sexual mode of transmission: Secondary | ICD-10-CM | POA: Diagnosis not present

## 2023-11-10 ENCOUNTER — Other Ambulatory Visit: Payer: Self-pay | Admitting: Medical Genetics

## 2023-11-16 ENCOUNTER — Other Ambulatory Visit: Payer: Self-pay | Attending: Medical Genetics

## 2023-12-04 ENCOUNTER — Encounter: Admitting: Nurse Practitioner

## 2023-12-18 ENCOUNTER — Encounter: Admitting: Nurse Practitioner

## 2023-12-18 NOTE — Progress Notes (Deleted)
 Name: Douglas Greer   MRN: [id number]    DOB: Apr 25, 1991   Date:12/18/2023       Progress Note  Subjective  Chief Complaint: Annual Well Exam    HPI  Patient presents for annual CPE .    Diet: *** Exercise: *** Sleep: *** Last dental exam:*** Last eye exam: ***  Depression: phq 9 is {gen pos [id number]}    01/14/2023    1:10 PM 11/30/2022    2:33 PM 11/30/2022    2:23 PM 11/09/2022   11:52 AM 06/13/2022   11:06 AM  Depression screen PHQ 2/9  Decreased Interest 0 0 0 0 2  Down, Depressed, Hopeless 0 0 0 0 2  PHQ - 2 Score 0 0 0 0 4  Altered sleeping 0 2   3  Tired, decreased energy 0 1   2  Change in appetite 0 0   0  Feeling bad or failure about yourself  0 1   3  Trouble concentrating 0 1   1  Moving slowly or fidgety/restless 0 0   2  Suicidal thoughts 0 0   1  PHQ-9 Score 0 5   16  Difficult doing work/chores Not difficult at all Not difficult at all   Somewhat difficult    Hypertension:  BP Readings from Last 3 Encounters:  08/09/23 122/70  01/14/23 122/76  01/03/23 135/79    Obesity: Wt Readings from Last 3 Encounters:  08/09/23 179 lb 12.8 oz (81.6 kg)  01/14/23 172 lb 9.6 oz (78.3 kg)  01/01/23 180 lb (81.6 kg)   BMI Readings from Last 3 Encounters:  08/09/23 27.34 kg/m  01/14/23 26.24 kg/m  01/01/23 27.37 kg/m     Lipids:  Lab Results  Component Value Date   CHOL 160 11/30/2022   CHOL 148 08/03/2021   CHOL 165 02/11/2019   Lab Results  Component Value Date   HDL 21 (L) 11/30/2022   HDL 16 (L) 08/03/2021   HDL 17 (L) 02/11/2019   Lab Results  Component Value Date   LDLCALC 103 (H) 11/30/2022   LDLCALC 100 (H) 08/03/2021   LDLCALC 107 (H) 02/11/2019   Lab Results  Component Value Date   TRIG 236 (H) 11/30/2022   TRIG 199 (H) 08/03/2021   TRIG 304 (H) 02/11/2019   Lab Results  Component Value Date   CHOLHDL 7.6 (H) 11/30/2022   CHOLHDL 9.3 (H) 08/03/2021   CHOLHDL 9.7 (H) 02/11/2019   No results found for:  LDLDIRECT Glucose:  Glucose, Bld  Date Value Ref Range Status  01/01/2023 119 (H) 70 - 99 mg/dL Final    Comment:    Glucose reference range applies only to samples taken after fasting for at least 8 hours.  11/30/2022 86 65 - 99 mg/dL Final    Comment:    .            Fasting reference interval .   08/03/2021 91 65 - 99 mg/dL Final    Comment:    .            Fasting reference interval .     Flowsheet Row Office Visit from 11/30/2022 in [facility]  AUDIT-C Score 1   ***  Single STD testing and prevention (HIV/chl/gon/syphilis): 11/30/2022 Hep C: 11/30/2022  Skin cancer: Discussed monitoring for atypical lesions Colorectal cancer: Does not qualify  Prostate cancer: *** No results found for: PSA   Lung cancer:  Does not qualify Low Dose CT  Chest recommended if Age 51-80 years, 30 pack-year currently smoking OR have quit w/in 15years. Patient does not qualify.   AAA: *** The USPSTF recommends one-time screening with ultrasonography in men ages 49 to 20 years who have ever smoked ECG:  01/03/2023  Vaccines:  HPV: up to at age 60 , ask insurance if age between 2-45  Shingrix: 52-64 yo and ask insurance if covered when patient above 46 yo Pneumonia: *** educated and discussed with patient. Flu: *** educated and discussed with patient.  Advanced Care Planning: A voluntary discussion about advance care planning including the explanation and discussion of advance directives.  Discussed health care proxy and Living will, and the patient was able to identify a health care proxy as ***.  Patient {DOES_DOES [id number]} have a living will at present time. If patient does have living will, I have requested they bring this to the clinic to be scanned in to their chart.  Patient Active Problem List   Diagnosis Date Noted   Nonspecific syndrome suggestive of viral illness 01/03/2023   Acute pharyngitis 01/03/2023   Moderate episode of recurrent major  depressive disorder (HCC) 06/01/2022   Anxiety 06/01/2022   Mixed hyperlipidemia 09/04/2019   Spontaneous pneumothorax 08/25/2019   Smoker 08/25/2019   Elevated serum glutamic pyruvic transaminase (SGPT) level 08/12/2018   Spondylosis without myelopathy or radiculopathy, cervical region 04/10/2018   Overweight 01/24/2018   Genital warts 10/04/2016   Tobacco use 11/27/2015   Total bilirubin, elevated 11/26/2015   Low HDL (under 40) 11/26/2015    Past Surgical History:  Procedure Laterality Date   INCISIONAL HERNIA REPAIR     NASAL FRACTURE SURGERY      Family History  Problem Relation Age of Onset   Hypertension Mother    Diabetes Father    Asthma Son    Allergies Son        peanuts    Social History   Socioeconomic History   Marital status: Single    Spouse name: Not on file   Number of children: 2   Years of education: 12   Highest education level: Bachelor's degree (e.g., BA, AB, BS)  Occupational History   Occupation: IT  Tobacco Use   Smoking status: Every Day    Current packs/day: 1.00    Average packs/day: 1 pack/day for 12.0 years (12.0 ttl pk-yrs)    Types: Cigarettes   Smokeless tobacco: Never  Vaping Use   Vaping status: Never Used  Substance and Sexual Activity   Alcohol use: Yes    Alcohol/week: 0.0 standard drinks of alcohol    Comment: occassional   Drug use: Yes    Types: Marijuana   Sexual activity: Yes  Other Topics Concern   Not on file  Social History Narrative   Not on file   Social Drivers of Health   Financial Resource Strain: Medium Risk (11/30/2022)   Overall Financial Resource Strain (CARDIA)    Difficulty of Paying Living Expenses: Somewhat hard  Food Insecurity: Food Insecurity Present (11/30/2022)   Hunger Vital Sign    Worried About Running Out of Food in the Last Year: Sometimes true    Ran Out of Food in the Last Year: Sometimes true  Transportation Needs: No Transportation Needs (11/30/2022)   PRAPARE - Therapist, art (Medical): No    Lack of Transportation (Non-Medical): No  Physical Activity: Sufficiently Active (11/30/2022)   Exercise Vital Sign    Days of Exercise per Week: 5  days    Minutes of Exercise per Session: 60 min  Stress: No Stress Concern Present (11/30/2022)   [employer] of Occupational Health - Occupational Stress Questionnaire    Feeling of Stress : Only a little  Recent Concern: Stress - Stress Concern Present (11/09/2022)   [employer] of Occupational Health - Occupational Stress Questionnaire    Feeling of Stress : To some extent  Social Connections: Moderately Isolated (11/30/2022)   Social Connection and Isolation Panel    Frequency of Communication with Friends and Family: More than three times a week    Frequency of Social Gatherings with Friends and Family: More than three times a week    Attends Religious Services: 1 to 4 times per year    Active Member of [employer] or Organizations: No    Attends Banker Meetings: Never    Marital Status: Never married  Intimate Partner Violence: Not At Risk (11/30/2022)   Humiliation, Afraid, Rape, and Kick questionnaire    Fear of Current or Ex-Partner: No    Emotionally Abused: No    Physically Abused: No    Sexually Abused: No     Current Outpatient Medications:    multivitamin (ONE-A-DAY MEN'S) TABS tablet, Take 1 tablet by mouth daily., Disp: , Rfl:    tadalafil  (CIALIS ) 20 MG tablet, Take 1 tablet 1 hour prior to intercourse., Disp: 18 tablet, Rfl: 0  Allergies  Allergen Reactions   Amoxil [Amoxicillin] Rash     ROS  ***   Objective  There were no vitals filed for this visit.  There is no height or weight on file to calculate BMI.  Physical Exam ***  No results found for this or any previous visit (from the past 2160 hours).   Fall Risk:    01/14/2023    1:09 PM 11/30/2022    2:23 PM 11/09/2022   11:52 AM 06/01/2022    2:05 PM 08/17/2021   10:52 AM  Fall Risk    Falls in the past year? 0 0 0 0 0  Number falls in past yr: 0 0 0 0 0  Injury with Fall? 0 0 0 0 0  Follow up    Falls evaluation completed  Falls evaluation completed      Data saved with a previous flowsheet row definition   ***   Functional Status Survey:   ***   Assessment & Plan  There are no diagnoses linked to this encounter.   -Prostate cancer screening and PSA options (with potential risks and benefits of testing vs not testing) were discussed along with recent recs/guidelines. -USPSTF grade A and B recommendations reviewed with patient; age-appropriate recommendations, preventive care, screening tests, etc discussed and encouraged; healthy living encouraged; see AVS for patient education given to patient -Discussed importance of 150 minutes of physical activity weekly, eat two servings of fish weekly, eat one serving of tree nuts ( cashews, pistachios, pecans, almonds.SABRA) every other day, eat 6 servings of fruit/vegetables daily and drink plenty of water and avoid sweet beverages.  -Reviewed Health Maintenance: YES

## 2023-12-31 ENCOUNTER — Other Ambulatory Visit: Payer: Self-pay

## 2023-12-31 ENCOUNTER — Ambulatory Visit (INDEPENDENT_AMBULATORY_CARE_PROVIDER_SITE_OTHER): Admitting: Nurse Practitioner

## 2023-12-31 ENCOUNTER — Other Ambulatory Visit (HOSPITAL_COMMUNITY)
Admission: RE | Admit: 2023-12-31 | Discharge: 2023-12-31 | Disposition: A | Discharge status: Home / Self Care | Source: Ambulatory Visit | Attending: Nurse Practitioner | Admitting: Nurse Practitioner

## 2023-12-31 ENCOUNTER — Encounter: Payer: Self-pay | Admitting: Nurse Practitioner

## 2023-12-31 VITALS — BP 120/72 | HR 78 | Temp 97.8°F | Resp 18 | Ht 68.0 in | Wt 176.1 lb

## 2023-12-31 DIAGNOSIS — Z0000 Encounter for general adult medical examination without abnormal findings: Secondary | ICD-10-CM | POA: Diagnosis not present

## 2023-12-31 DIAGNOSIS — Z130 Encounter for screening for diseases of the blood and blood-forming organs and certain disorders involving the immune mechanism: Secondary | ICD-10-CM

## 2023-12-31 DIAGNOSIS — Z113 Encounter for screening for infections with a predominantly sexual mode of transmission: Secondary | ICD-10-CM

## 2023-12-31 DIAGNOSIS — Z13220 Encounter for screening for lipoid disorders: Secondary | ICD-10-CM

## 2023-12-31 DIAGNOSIS — B079 Viral wart, unspecified: Secondary | ICD-10-CM

## 2023-12-31 DIAGNOSIS — Z131 Encounter for screening for diabetes mellitus: Secondary | ICD-10-CM | POA: Diagnosis not present

## 2023-12-31 NOTE — Progress Notes (Signed)
 Name: Douglas Greer   MRN: [id number]    DOB: 07-02-1990   Date:12/31/2023       Progress Note  Subjective  Chief Complaint  Chief Complaint  Patient presents with   Annual Exam    HPI  Patient presents for annual CPE . Discussed the use of AI scribe software for clinical note transcription with the patient, who gave verbal consent to proceed.  History of Present Illness Douglas Greer is a 33 year old male who presents for routine follow-up and STD screening.  Sexually transmitted infection screening - Undergoing full panel STD screening including HIV, hepatitis C, syphilis, gonorrhea, chlamydia, and herpes - Previously screened for HIV and hepatitis C  Tobacco use - Smokes tobacco - Finds it difficult to quit  General health maintenance - Maintains a good diet - Engages in regular exercise - Weight is stable - Obtains six hours of sleep per night  Dermatologic care coordination - Requests referral to dermatologist after missing a previous call from dermatology      Diet: Regular Exercise: 3 days 30 minutes Sleep: 6 Last dental exam:completed Last eye exam: completed Depression: phq 9 is negative    12/31/2023    7:54 AM 01/14/2023    1:10 PM 11/30/2022    2:33 PM 11/30/2022    2:23 PM 11/09/2022   11:52 AM  Depression screen PHQ 2/9  Decreased Interest 0 0 0 0 0  Down, Depressed, Hopeless 0 0 0 0 0  PHQ - 2 Score 0 0 0 0 0  Altered sleeping  0 2    Tired, decreased energy  0 1    Change in appetite  0 0    Feeling bad or failure about yourself   0 1    Trouble concentrating  0 1    Moving slowly or fidgety/restless  0 0    Suicidal thoughts  0 0    PHQ-9 Score  0 5    Difficult doing work/chores  Not difficult at all Not difficult at all      Hypertension:  BP Readings from Last 3 Encounters:  12/31/23 120/72  08/09/23 122/70  01/14/23 122/76    Obesity: Wt Readings from Last 3 Encounters:  12/31/23 176 lb 1.6 oz (79.9 kg)  08/09/23 179  lb 12.8 oz (81.6 kg)  01/14/23 172 lb 9.6 oz (78.3 kg)   BMI Readings from Last 3 Encounters:  12/31/23 26.78 kg/m  08/09/23 27.34 kg/m  01/14/23 26.24 kg/m     Lipids:  Lab Results  Component Value Date   CHOL 160 11/30/2022   CHOL 148 08/03/2021   CHOL 165 02/11/2019   Lab Results  Component Value Date   HDL 21 (L) 11/30/2022   HDL 16 (L) 08/03/2021   HDL 17 (L) 02/11/2019   Lab Results  Component Value Date   LDLCALC 103 (H) 11/30/2022   LDLCALC 100 (H) 08/03/2021   LDLCALC 107 (H) 02/11/2019   Lab Results  Component Value Date   TRIG 236 (H) 11/30/2022   TRIG 199 (H) 08/03/2021   TRIG 304 (H) 02/11/2019   Lab Results  Component Value Date   CHOLHDL 7.6 (H) 11/30/2022   CHOLHDL 9.3 (H) 08/03/2021   CHOLHDL 9.7 (H) 02/11/2019   No results found for: LDLDIRECT Glucose:  Glucose, Bld  Date Value Ref Range Status  01/01/2023 119 (H) 70 - 99 mg/dL Final    Comment:    Glucose reference range applies only to samples taken after fasting  for at least 8 hours.  11/30/2022 86 65 - 99 mg/dL Final    Comment:    .            Fasting reference interval .   08/03/2021 91 65 - 99 mg/dL Final    Comment:    .            Fasting reference interval .     Flowsheet Row Office Visit from 12/31/2023 in [facility]  AUDIT-C Score 2    Single STD testing and prevention (HIV/chl/gon/syphilis): completed Hep C: completed  Skin cancer: Discussed monitoring for atypical lesions Colorectal cancer: NA Prostate cancer: NA No results found for: PSA   Lung cancer:   Low Dose CT Chest recommended if Age 81-80 years, 30 pack-year currently smoking OR have quit w/in 15years. Patient does not qualify.   AAA:  The USPSTF recommends one-time screening with ultrasonography in men ages 39 to 63 years who have ever smoked ECG:  01/03/2023  Vaccines:  HPV: up to at age 54 , ask insurance if age between 51-45  Shingrix: 32-64 yo and ask  insurance if covered when patient above 44 yo Pneumonia:  educated and discussed with patient. Flu:  educated and discussed with patient.  Advanced Care Planning: A voluntary discussion about advance care planning including the explanation and discussion of advance directives.  Discussed health care proxy and Living will, and the patient was able to identify a health care proxy as aunt.  Patient does not have a living will at present time. If patient does have living will, I have requested they bring this to the clinic to be scanned in to their chart.  Patient Active Problem List   Diagnosis Date Noted   Nonspecific syndrome suggestive of viral illness 01/03/2023   Acute pharyngitis 01/03/2023   Moderate episode of recurrent major depressive disorder (HCC) 06/01/2022   Anxiety 06/01/2022   Mixed hyperlipidemia 09/04/2019   Spontaneous pneumothorax 08/25/2019   Smoker 08/25/2019   Elevated serum glutamic pyruvic transaminase (SGPT) level 08/12/2018   Spondylosis without myelopathy or radiculopathy, cervical region 04/10/2018   Tobacco use 11/27/2015   Total bilirubin, elevated 11/26/2015   Low HDL (under 40) 11/26/2015    Past Surgical History:  Procedure Laterality Date   INCISIONAL HERNIA REPAIR     NASAL FRACTURE SURGERY      Family History  Problem Relation Age of Onset   Hypertension Mother    Diabetes Father    Asthma Son    Allergies Son        peanuts    Social History   Socioeconomic History   Marital status: Single    Spouse name: Not on file   Number of children: 2   Years of education: 12   Highest education level: Bachelor's degree (e.g., BA, AB, BS)  Occupational History   Occupation: IT  Tobacco Use   Smoking status: Every Day    Current packs/day: 1.00    Average packs/day: 1 pack/day for 12.0 years (12.0 ttl pk-yrs)    Types: Cigarettes   Smokeless tobacco: Never  Vaping Use   Vaping status: Never Used  Substance and Sexual Activity   Alcohol  use: Yes    Alcohol/week: 0.0 standard drinks of alcohol    Comment: occassional   Drug use: Yes    Types: Marijuana   Sexual activity: Yes  Other Topics Concern   Not on file  Social History Narrative   Not on  file   Social Drivers of Health   Financial Resource Strain: Medium Risk (12/31/2023)   Overall Financial Resource Strain (CARDIA)    Difficulty of Paying Living Expenses: Somewhat hard  Food Insecurity: No Food Insecurity (12/31/2023)   Hunger Vital Sign    Worried About Running Out of Food in the Last Year: Never true    Ran Out of Food in the Last Year: Never true  Transportation Needs: No Transportation Needs (12/31/2023)   PRAPARE - Administrator, Civil Service (Medical): No    Lack of Transportation (Non-Medical): No  Physical Activity: Insufficiently Active (12/31/2023)   Exercise Vital Sign    Days of Exercise per Week: 3 days    Minutes of Exercise per Session: 20 min  Stress: No Stress Concern Present (12/31/2023)   [employer] of Occupational Health - Occupational Stress Questionnaire    Feeling of Stress: Only a little  Social Connections: Moderately Isolated (12/31/2023)   Social Connection and Isolation Panel    Frequency of Communication with Friends and Family: More than three times a week    Frequency of Social Gatherings with Friends and Family: More than three times a week    Attends Religious Services: More than 4 times per year    Active Member of [employer] or Organizations: No    Attends Banker Meetings: Never    Marital Status: Never married  Intimate Partner Violence: Not At Risk (12/31/2023)   Humiliation, Afraid, Rape, and Kick questionnaire    Fear of Current or Ex-Partner: No    Emotionally Abused: No    Physically Abused: No    Sexually Abused: No     Current Outpatient Medications:    multivitamin (ONE-A-DAY MEN'S) TABS tablet, Take 1 tablet by mouth daily., Disp: , Rfl:    tadalafil  (CIALIS ) 20 MG tablet,  Take 1 tablet 1 hour prior to intercourse., Disp: 18 tablet, Rfl: 0  Allergies  Allergen Reactions   Amoxil [Amoxicillin] Rash     ROS  Constitutional: Negative for fever or weight change.  Respiratory: Negative for cough and shortness of breath.   Cardiovascular: Negative for chest pain or palpitations.  Gastrointestinal: Negative for abdominal pain, no bowel changes.  Musculoskeletal: Negative for gait problem or joint swelling.  Skin: Negative for rash.  Neurological: Negative for dizziness or headache.  No other specific complaints in a complete review of systems (except as listed in HPI above).    Objective  Vitals:   12/31/23 0755  BP: 120/72  Pulse: 78  Resp: 18  Temp: 97.8 F (36.6 C)  TempSrc: Oral  SpO2: 98%  Weight: 176 lb 1.6 oz (79.9 kg)  Height: 5' 8 (1.727 m)    Body mass index is 26.78 kg/m.  Physical Exam Vitals reviewed.  Constitutional:      Appearance: Normal appearance.  HENT:     Head: Normocephalic.     Right Ear: Tympanic membrane normal.     Left Ear: Tympanic membrane normal.     Nose: Nose normal.  Eyes:     Extraocular Movements: Extraocular movements intact.     Conjunctiva/sclera: Conjunctivae normal.     Pupils: Pupils are equal, round, and reactive to light.  Neck:     Thyroid: No thyroid mass, thyromegaly or thyroid tenderness.  Cardiovascular:     Rate and Rhythm: Normal rate and regular rhythm.     Pulses: Normal pulses.     Heart sounds: Normal heart sounds.  Pulmonary:  Effort: Pulmonary effort is normal.     Breath sounds: Normal breath sounds.  Abdominal:     General: Bowel sounds are normal.     Palpations: Abdomen is soft.  Musculoskeletal:        General: Normal range of motion.     Cervical back: Normal range of motion and neck supple.     Right lower leg: No edema.     Left lower leg: No edema.  Skin:    General: Skin is warm and dry.     Capillary Refill: Capillary refill takes less than 2 seconds.   Neurological:     General: No focal deficit present.     Mental Status: He is alert and oriented to person, place, and time. Mental status is at baseline.  Psychiatric:        Mood and Affect: Mood normal.        Behavior: Behavior normal.        Thought Content: Thought content normal.        Judgment: Judgment normal.      No results found for this or any previous visit (from the past 2160 hours).   Fall Risk:    12/31/2023    7:54 AM 01/14/2023    1:09 PM 11/30/2022    2:23 PM 11/09/2022   11:52 AM 06/01/2022    2:05 PM  Fall Risk   Falls in the past year? 0 0 0 0 0  Number falls in past yr: 0 0 0 0 0  Injury with Fall? 0 0 0 0 0  Risk for fall due to : No Fall Risks      Follow up Falls evaluation completed    Falls evaluation completed      Data saved with a previous flowsheet row definition      Functional Status Survey: Is the patient deaf or have difficulty hearing?: No Does the patient have difficulty seeing, even when wearing glasses/contacts?: No Does the patient have difficulty concentrating, remembering, or making decisions?: No Does the patient have difficulty walking or climbing stairs?: No Does the patient have difficulty dressing or bathing?: No Does the patient have difficulty doing errands alone such as visiting a doctor's office or shopping?: No    Assessment & Plan  Problem List Items Addressed This Visit   None Visit Diagnoses       Physical exam, annual    -  Primary   Relevant Orders   CBC with Differential/Platelet   Comprehensive metabolic panel with GFR   Lipid panel   Hemoglobin A1c     Screening examination for STD (sexually transmitted disease)       Relevant Orders   HSV(herpes simplex vrs) 1+2 ab-IgG   Hepatitis C antibody   HIV Antibody (routine testing w rflx)   RPR   Urine cytology ancillary only     Screening for cholesterol level       Relevant Orders   Lipid panel     Screening for diabetes mellitus       Relevant  Orders   Comprehensive metabolic panel with GFR   Hemoglobin A1c     Screening for deficiency anemia       Relevant Orders   CBC with Differential/Platelet      Assessment and Plan Assessment & Plan Annual exam Routine annual examination with stable vital signs, including blood pressure at 120/72 mmHg. Cardiovascular, respiratory, and neurological examinations are unremarkable. - Perform routine blood work including CBC, kidney  and liver function tests, and electrolytes - Refer to dermatologist for follow-up  Std screening Requests full panel STD screening for reassurance, including HIV, hepatitis C, syphilis, gonorrhea, chlamydia, and herpes, due to awareness of herpes prevalence in the area. - Order full panel STD screening including HIV, hepatitis C, syphilis, gonorrhea, chlamydia, and herpes  Tobacco use Continues to smoke. Discussed the importance of smoking cessation for overall health improvement and acknowledged the difficulty in quitting. - Encourage smoking cessation and reduction in smoking   Wart on finger -previously had referral to derm placed but did not go, would like to see them now for removal.  Referral placed   -Prostate cancer screening and PSA options (with potential risks and benefits of testing vs not testing) were discussed along with recent recs/guidelines. -USPSTF grade A and B recommendations reviewed with patient; age-appropriate recommendations, preventive care, screening tests, etc discussed and encouraged; healthy living encouraged; see AVS for patient education given to patient -Discussed importance of 150 minutes of physical activity weekly, eat two servings of fish weekly, eat one serving of tree nuts ( cashews, pistachios, pecans, almonds.SABRA) every other day, eat 6 servings of fruit/vegetables daily and drink plenty of water and avoid sweet beverages.  -Reviewed Health Maintenance: yes

## 2024-01-01 LAB — COMPREHENSIVE METABOLIC PANEL WITH GFR
AG Ratio: 2.2 (calc) (ref 1.0–2.5)
ALT: 23 U/L (ref 9–46)
AST: 17 U/L (ref 10–40)
Albumin: 4.4 g/dL (ref 3.6–5.1)
Alkaline phosphatase (APISO): 88 U/L (ref 36–130)
BUN: 13 mg/dL (ref 7–25)
CO2: 24 mmol/L (ref 20–32)
Calcium: 9.4 mg/dL (ref 8.6–10.3)
Chloride: 109 mmol/L (ref 98–110)
Creat: 0.95 mg/dL (ref 0.60–1.26)
Globulin: 2 g/dL (ref 1.9–3.7)
Glucose, Bld: 77 mg/dL (ref 65–99)
Potassium: 3.8 mmol/L (ref 3.5–5.3)
Sodium: 142 mmol/L (ref 135–146)
Total Bilirubin: 0.6 mg/dL (ref 0.2–1.2)
Total Protein: 6.4 g/dL (ref 6.1–8.1)
eGFR: 108 mL/min/1.73m2 (ref >=60)

## 2024-01-01 LAB — CBC WITH DIFFERENTIAL/PLATELET
Absolute Lymphocytes: 3485 {cells}/uL (ref 850–3900)
Absolute Monocytes: 968 {cells}/uL — ABNORMAL HIGH (ref 200–950)
Basophils Absolute: 35 {cells}/uL (ref 0–200)
Basophils Relative: 0.2 %
Eosinophils Absolute: 194 {cells}/uL (ref 15–500)
Eosinophils Relative: 1.1 %
HCT: 51.2 % — ABNORMAL HIGH (ref 38.5–50.0)
Hemoglobin: 17.1 g/dL (ref 13.2–17.1)
MCH: 33.5 pg — ABNORMAL HIGH (ref 27.0–33.0)
MCHC: 33.4 g/dL (ref 32.0–36.0)
MCV: 100.2 fL — ABNORMAL HIGH (ref 80.0–100.0)
MPV: 11.7 fL (ref 7.5–12.5)
Monocytes Relative: 5.5 %
Neutro Abs: 12918 {cells}/uL — ABNORMAL HIGH (ref 1500–7800)
Neutrophils Relative %: 73.4 %
Platelets: 156 Thousand/uL (ref 140–400)
RBC: 5.11 Million/uL (ref 4.20–5.80)
RDW: 12.6 % (ref 11.0–15.0)
Total Lymphocyte: 19.8 %
WBC: 17.6 Thousand/uL — ABNORMAL HIGH (ref 3.8–10.8)

## 2024-01-01 LAB — LIPID PANEL
Cholesterol: 144 mg/dL (ref <200)
HDL: 21 mg/dL — ABNORMAL LOW (ref >=40)
LDL Cholesterol (Calc): 91 mg/dL
Non-HDL Cholesterol (Calc): 123 mg/dL (ref <130)
Total CHOL/HDL Ratio: 6.9 (calc) — ABNORMAL HIGH (ref <5.0)
Triglycerides: 219 mg/dL — ABNORMAL HIGH (ref <150)

## 2024-01-01 LAB — HSV(HERPES SIMPLEX VRS) I + II AB-IGG
HSV 1 IGG,TYPE SPECIFIC AB: <0.9 {index}
HSV 2 IGG,TYPE SPECIFIC AB: <0.9 {index}

## 2024-01-01 LAB — URINE CYTOLOGY ANCILLARY ONLY
Chlamydia: NEGATIVE
Comment: NEGATIVE
Comment: NORMAL
Neisseria Gonorrhea: NEGATIVE

## 2024-01-01 LAB — RPR: RPR Ser Ql: NONREACTIVE

## 2024-01-01 LAB — HEMOGLOBIN A1C
Hgb A1c MFr Bld: 4.9 % (ref <5.7)
Mean Plasma Glucose: 94 mg/dL
eAG (mmol/L): 5.2 mmol/L

## 2024-01-01 LAB — HIV ANTIBODY (ROUTINE TESTING W REFLEX): HIV 1&2 Ab, 4th Generation: NONREACTIVE

## 2024-01-01 LAB — HEPATITIS C ANTIBODY: Hepatitis C Ab: NONREACTIVE

## 2024-01-02 ENCOUNTER — Ambulatory Visit: Payer: Self-pay | Admitting: Nurse Practitioner

## 2024-02-22 DIAGNOSIS — T07XXXA Unspecified multiple injuries, initial encounter: Secondary | ICD-10-CM | POA: Diagnosis not present

## 2024-03-13 ENCOUNTER — Other Ambulatory Visit: Payer: Self-pay | Admitting: Medical Genetics

## 2024-03-13 ENCOUNTER — Encounter: Payer: Self-pay | Admitting: *Deleted

## 2024-03-13 DIAGNOSIS — Z006 Encounter for examination for normal comparison and control in clinical research program: Secondary | ICD-10-CM

## 2024-03-17 ENCOUNTER — Ambulatory Visit: Admitting: Nurse Practitioner
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
## 2024-03-25 ENCOUNTER — Ambulatory Visit: Admitting: Nurse Practitioner

## 2024-04-14 ENCOUNTER — Ambulatory Visit: Admitting: Nurse Practitioner

## 2024-04-21 LAB — GENECONNECT MOLECULAR SCREEN: Genetic Analysis Overall Interpretation: NEGATIVE

## 2024-04-23 ENCOUNTER — Ambulatory Visit: Admitting: Nurse Practitioner

## 2024-08-03 ENCOUNTER — Ambulatory Visit: Admitting: Physician Assistant
